# Patient Record
Sex: Female | Born: 1990 | Race: White | Hispanic: No | Marital: Married | Smoking: Never smoker
Health system: Southern US, Community
[De-identification: ages and names within clinical notes are randomized; demographics above are authoritative.]

## PROBLEM LIST (undated history)

## (undated) ENCOUNTER — Inpatient Hospital Stay: Payer: Self-pay

## (undated) DIAGNOSIS — Z87442 Personal history of urinary calculi: Secondary | ICD-10-CM

## (undated) DIAGNOSIS — J3089 Other allergic rhinitis: Secondary | ICD-10-CM

## (undated) DIAGNOSIS — Z8489 Family history of other specified conditions: Secondary | ICD-10-CM

## (undated) DIAGNOSIS — J189 Pneumonia, unspecified organism: Secondary | ICD-10-CM

## (undated) DIAGNOSIS — J45909 Unspecified asthma, uncomplicated: Secondary | ICD-10-CM

## (undated) DIAGNOSIS — K219 Gastro-esophageal reflux disease without esophagitis: Secondary | ICD-10-CM

## (undated) DIAGNOSIS — J302 Other seasonal allergic rhinitis: Secondary | ICD-10-CM

## (undated) DIAGNOSIS — Z3009 Encounter for other general counseling and advice on contraception: Secondary | ICD-10-CM

## (undated) HISTORY — PX: TONSILLECTOMY: SUR1361

## (undated) HISTORY — DX: Encounter for other general counseling and advice on contraception: Z30.09

## (undated) HISTORY — PX: WISDOM TOOTH EXTRACTION: SHX21

---
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
## 2011-03-15 ENCOUNTER — Ambulatory Visit: Payer: Self-pay | Admitting: Internal Medicine

## 2011-04-03 ENCOUNTER — Ambulatory Visit: Payer: Self-pay | Admitting: Family Medicine

## 2011-05-03 ENCOUNTER — Ambulatory Visit: Payer: Self-pay | Admitting: Internal Medicine

## 2011-12-21 ENCOUNTER — Emergency Department: Payer: Self-pay | Admitting: Unknown Physician Specialty

## 2011-12-21 LAB — URINALYSIS, COMPLETE
RBC,UR: 3 /HPF (ref 0–5)
Squamous Epithelial: 33

## 2011-12-21 LAB — CBC
HCT: 41.3 % (ref 35.0–47.0)
HGB: 13.8 g/dL (ref 12.0–16.0)
MCH: 31.3 pg (ref 26.0–34.0)
MCV: 94 fL (ref 80–100)
Platelet: 186 10*3/uL (ref 150–440)
RBC: 4.41 10*6/uL (ref 3.80–5.20)
RDW: 10.9 % — ABNORMAL LOW (ref 11.5–14.5)
WBC: 5.9 10*3/uL (ref 3.6–11.0)

## 2011-12-21 LAB — PREGNANCY, URINE: Pregnancy Test, Urine: NEGATIVE m[IU]/mL

## 2011-12-21 LAB — COMPREHENSIVE METABOLIC PANEL
Albumin: 4.2 g/dL (ref 3.8–5.6)
Anion Gap: 13 (ref 7–16)
Calcium, Total: 9 mg/dL (ref 9.0–10.7)
Creatinine: 0.74 mg/dL (ref 0.60–1.30)
EGFR (African American): >60
Glucose: 99 mg/dL (ref 65–99)
SGOT(AST): 22 U/L (ref 0–26)
SGPT (ALT): 18 U/L
Sodium: 142 mmol/L (ref 136–145)
Total Protein: 7.4 g/dL (ref 6.4–8.6)

## 2011-12-21 LAB — LIPASE, BLOOD: Lipase: 55 U/L — ABNORMAL LOW (ref 73–393)

## 2012-01-16 ENCOUNTER — Ambulatory Visit: Payer: Self-pay | Admitting: Urology

## 2012-07-02 ENCOUNTER — Ambulatory Visit: Payer: Self-pay | Admitting: Internal Medicine

## 2012-12-31 ENCOUNTER — Ambulatory Visit: Payer: Self-pay | Admitting: Physician Assistant

## 2013-04-07 ENCOUNTER — Emergency Department: Payer: Self-pay | Admitting: Emergency Medicine

## 2013-11-18 ENCOUNTER — Ambulatory Visit: Payer: Self-pay | Admitting: Otolaryngology

## 2013-11-19 LAB — PATHOLOGY REPORT

## 2014-01-30 IMAGING — CT CT HEAD WITHOUT CONTRAST
1 of 2 series · 15 of 30 positions shown, 19 images · non-contrast
Comparison: none

REASON FOR EXAM: MCV 2 days ago, LOC, dizziness, nausea, vomiting
COMMENTS:

PROCEDURE:     CT  - CT HEAD WITHOUT CONTRAST  - April 07, 2013  [DATE]
RESULT:     Abdominal CT dated
TECHNIQUE: Helical noncontrasted 3 mm sections were obtained the lung bases
through the pubic symphysis.

[Series 6: soft (id) · axial · 0.42mm/px · z∈[-198,-59]mm · 15 of 32 slices shown, 19 images]
[im 2/32  brain]
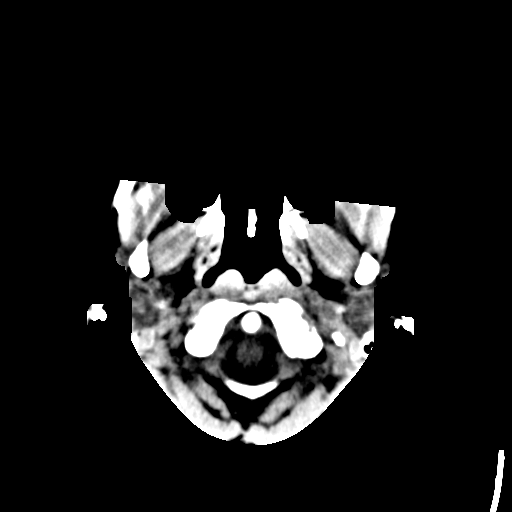
[im 2/32  bone]
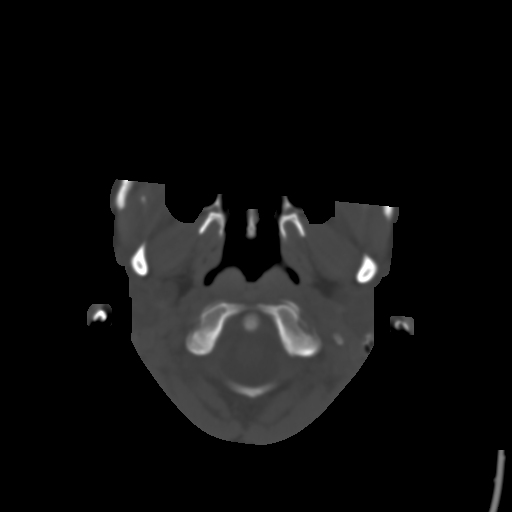
[im 4/32  brain]
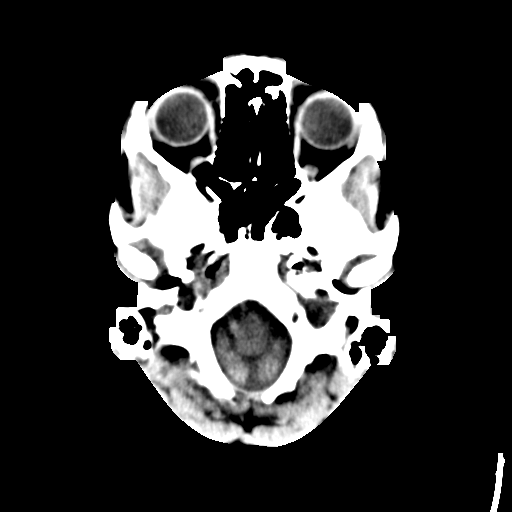
[im 6/32  brain]
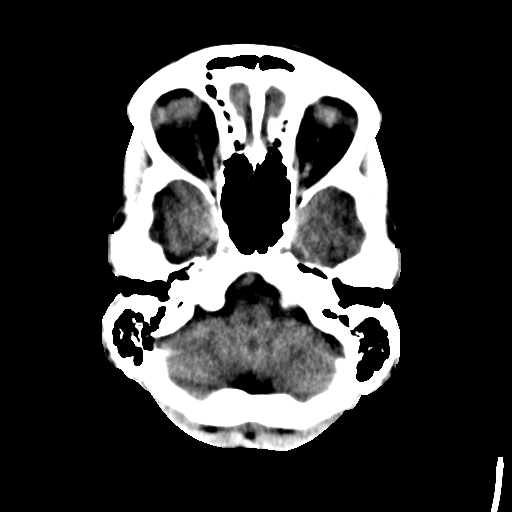
[im 8/32  brain]
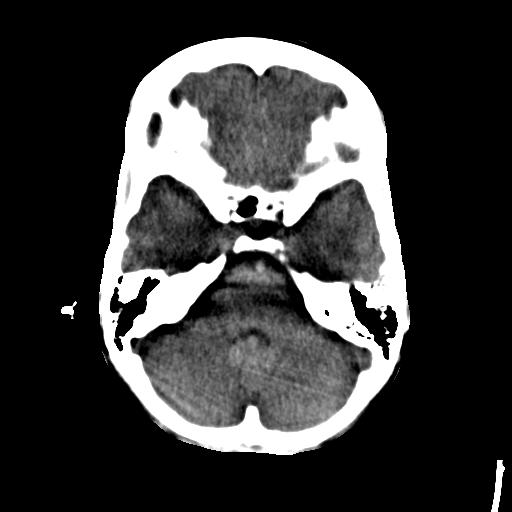
[im 10/32  brain]
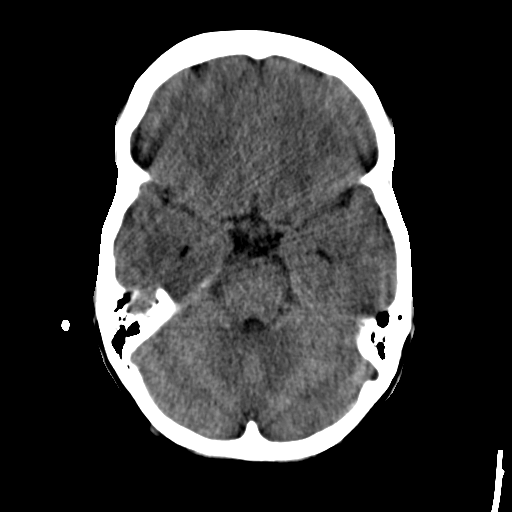
[im 10/32  bone]
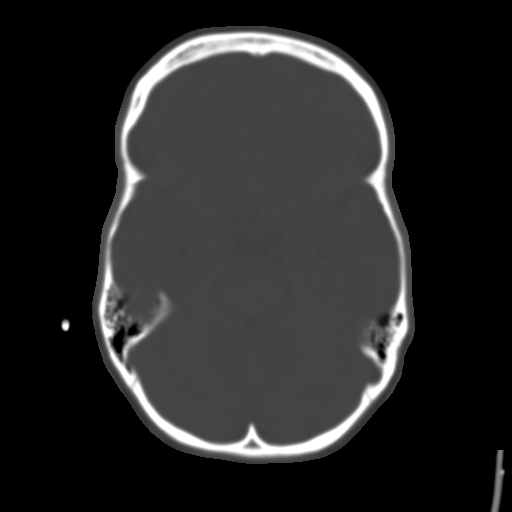
[im 12/32  brain]
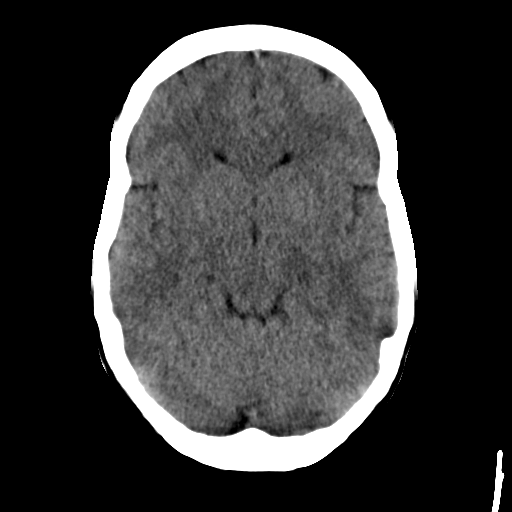
[im 13/32  brain]
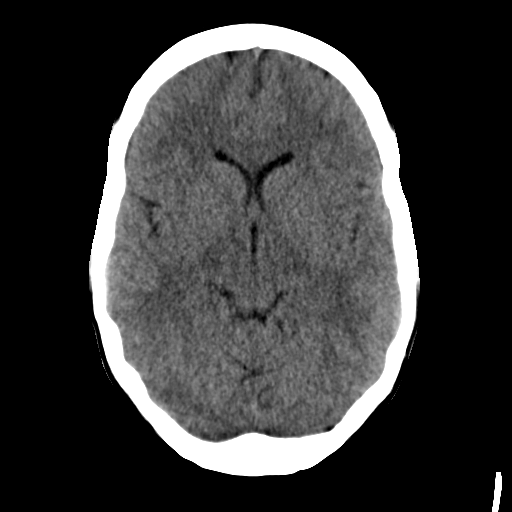
[im 16/32  brain]
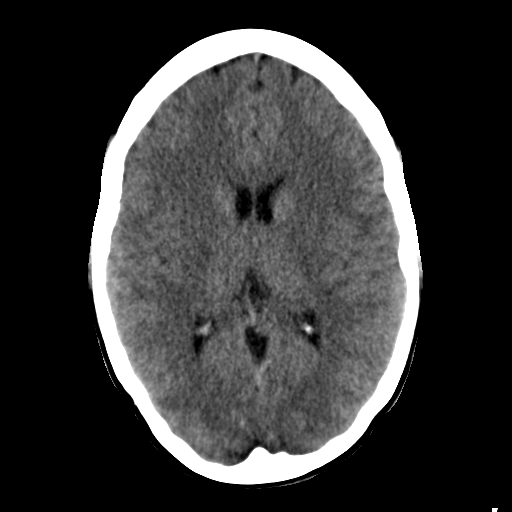
[im 19/32  brain]
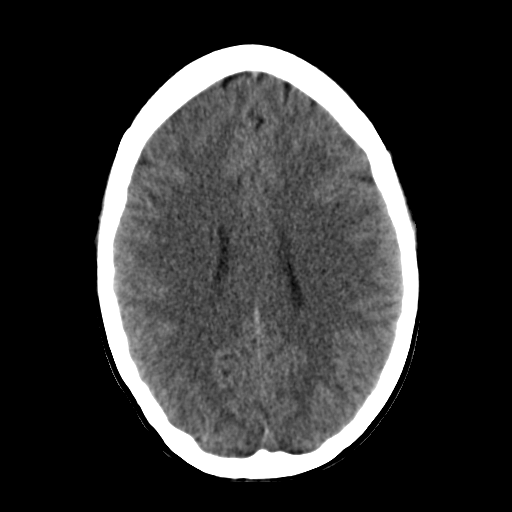
[im 19/32  bone]
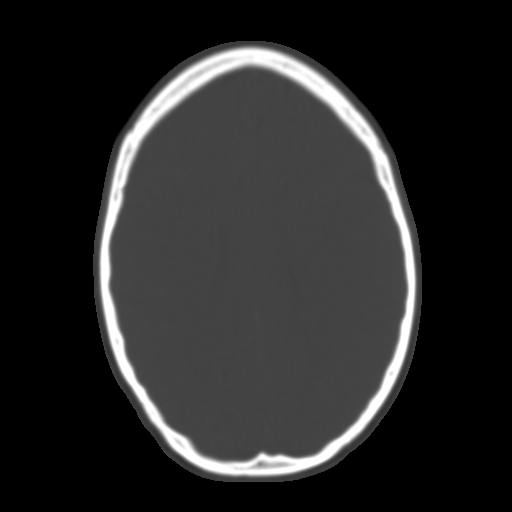
[im 20/32  brain]
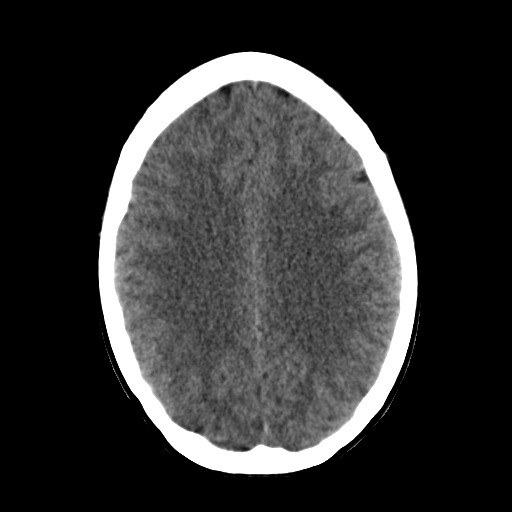
[im 22/32  brain]
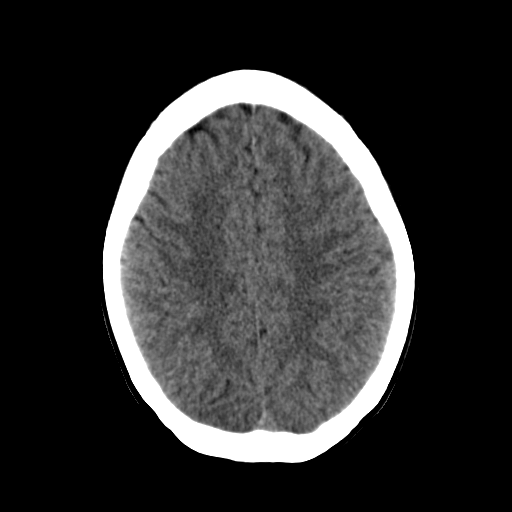
[im 24/32  brain]
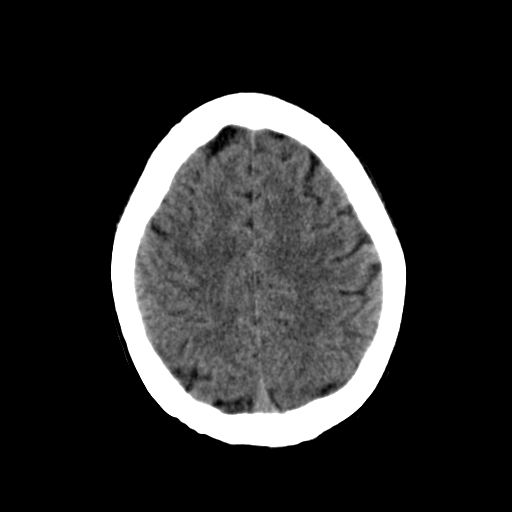
[im 26/32  brain]
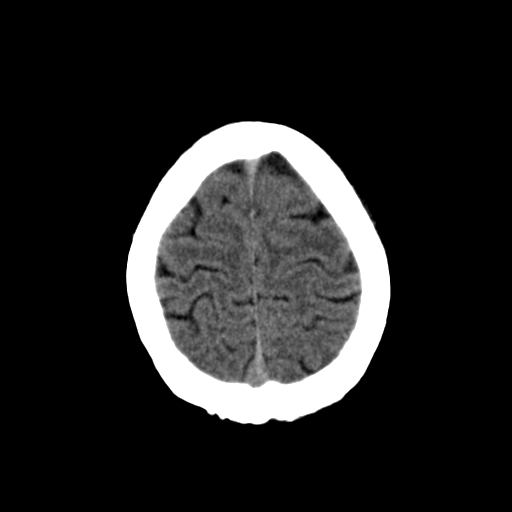
[im 26/32  bone]
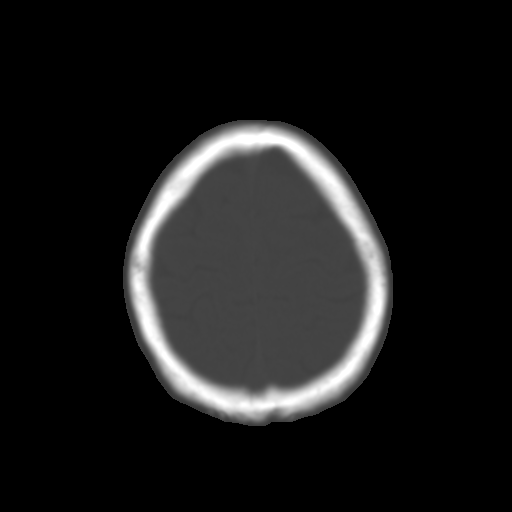
[im 28/32  brain]
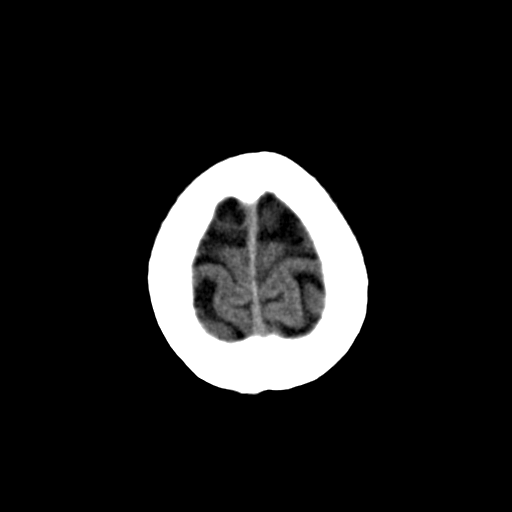
[im 30/32  brain]
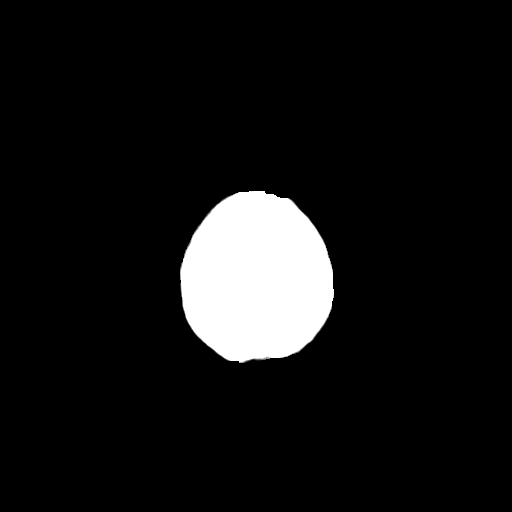

[15 of 30 positions shown; findings below may reference images not displayed]

FINDINGS: The lung bases are unremarkable.

Noncontrast evaluation of the liver, spleen, adrenals, pancreas, kidneys is
unremarkable. There is no CT evidence of bowel obstruction, enteritis,
colitis, diverticulitis, nor appendicitis within the limitations of a
noncontrasted CT. There is no evidence of abdominal aortic aneurysm. There
is no gross evidence of pathologic sized adenopathy, masses, loculated fluid
collections nor evidence of free fluid within the limitations of a
noncontrasted CT.
The visualized paranasal sinuses and mastoid air cells are patent.
IMPRESSION: No CT evidence of obstructive or inflammatory abnormalities.
2. Schalkhammer Isgum PA of the emergency department was informed of these findings
via a preliminary faxed report.

## 2014-07-06 ENCOUNTER — Ambulatory Visit: Payer: Self-pay | Admitting: Physician Assistant

## 2014-09-03 ENCOUNTER — Ambulatory Visit: Payer: Self-pay | Admitting: Physician Assistant

## 2015-03-27 NOTE — Op Note (Signed)
PATIENT NAME:  Bethany Gutierrez, Bethany Gutierrez MR#:  [id number] DATE OF BIRTH:  29-Oct-1991  DATE OF PROCEDURE:  11/18/2013  PREOPERATIVE DIAGNOSIS: Chronic tonsillitis.   POSTOPERATIVE DIAGNOSIS: Chronic tonsillitis.  PROCEDURE PERFORMED: Tonsillectomy and adenoidectomy greater than age 24.   SURGEON: Bud Facereighton Kobee Medlen, M.D.   ANESTHESIA: General endotracheal anesthesia.   ESTIMATED BLOOD LOSS: Less than 10 mL.   IV FLUIDS: Please see anesthesia record.   COMPLICATIONS: None.   DRAINS/STENT PLACEMENTS: None.   SPECIMENS: Right and left tonsils. Adenoids were ablated so no specimen obtained.   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   OPERATIVE FINDINGS: 2+ cryptic tonsils with moderate fibrosis and erythema surrounding the left tonsil.   DESCRIPTION OF PROCEDURE: After the patient was identified in holding, benefits and risks of the procedure were discussed and consent was reviewed, the patient was taken to the Operating Room and placed supine position. General endotracheal anesthesia was induced. The patient was rotated 45 degrees. A McIvor mouth gag was inserted in the patient's oral cavity and suspended from a Mayo stand. Visualization of the patient's oral cavity revealed 2+ tonsils left greater than right. A curved Allis clamp was attached to the superior pole of the patient's right tonsil. This was retracted medially and inferiorly and the patient's right tonsil was excised in a subcapsular plane. There was a moderate amount of fibrotic scarring. Hemostasis was achieved along the tonsillar bed using Bovie suction cautery and attention was directed to the patient's left tonsil. In a similar fashion, the patient's left tonsil was grasped at its superior pole. This was retracted medially and inferiorly and the patient's left tonsil was excised in a  subcapsular plane. There was a moderate amount of erythema and fibrotic scarring along the tonsillar capsule with the underlying musculature. Once this tonsil was excised in its entirety the bilateral tonsillar beds were inspected and meticulous hemostasis was ensured with Bovie suction cautery. A red rubber catheter was placed in the patient's right nasal cavity for retraction of the uvula and soft palate superiorly, and under indirect visualization, the patient's adenoid tissue was visualized and noted to be 2+ in appearance and Bovie suction cautery was used to ablate and desiccate the remaining adenoid tissue for visualization of a widely patent choana. At this time, the patient's oral cavity was copiously irrigated with sterile saline. Meticulous hemostasis was ensured and 2 mL of 0.5% plain Marcaine were injected into the anterior and posterior tonsillar pillars bilaterally. Care of the patient at this time was transferred to anesthesia, where the patient tolerated the procedure  well.     ____________________________ Kyung Ruddreighton C. Gianno Volner, MD ccv:sg D: 11/18/2013 07:53:28 ET T: 11/18/2013 08:37:27 ET JOB#: [id number]  cc: Kyung Ruddreighton C. Shaw Dobek, MD, <Dictator> Kyung RuddREIGHTON C Aviana Shevlin MD ELECTRONICALLY SIGNED 12/02/2013 10:38

## 2015-08-01 ENCOUNTER — Ambulatory Visit
Admission: EM | Admit: 2015-08-01 | Discharge: 2015-08-01 | Disposition: A | Discharge status: Home / Self Care | Payer: BLUE CROSS/BLUE SHIELD | Attending: Family Medicine | Admitting: Family Medicine

## 2015-08-01 ENCOUNTER — Encounter: Payer: Self-pay | Admitting: Emergency Medicine

## 2015-08-01 DIAGNOSIS — S39012A Strain of muscle, fascia and tendon of lower back, initial encounter: Secondary | ICD-10-CM | POA: Diagnosis not present

## 2015-08-01 DIAGNOSIS — N390 Urinary tract infection, site not specified: Secondary | ICD-10-CM | POA: Diagnosis not present

## 2015-08-01 HISTORY — DX: Other seasonal allergic rhinitis: J30.2

## 2015-08-01 LAB — URINALYSIS COMPLETE WITH MICROSCOPIC (ARMC ONLY)
BILIRUBIN URINE: NEGATIVE
GLUCOSE, UA: NEGATIVE mg/dL
Hgb urine dipstick: NEGATIVE
KETONES UR: NEGATIVE mg/dL
Leukocytes, UA: NEGATIVE
Nitrite: POSITIVE — AB
Protein, ur: NEGATIVE mg/dL
RBC / HPF: NONE SEEN RBC/hpf (ref <3)
Specific Gravity, Urine: 1.015 (ref 1.005–1.030)
pH: 7 (ref 5.0–8.0)

## 2015-08-01 MED ORDER — CYCLOBENZAPRINE HCL 10 MG PO TABS: 10.0000 mg | ORAL_TABLET | Freq: Every day | ORAL | Status: DC

## 2015-08-01 MED ORDER — FLUCONAZOLE 150 MG PO TABS: 150.0000 mg | ORAL_TABLET | Freq: Once | ORAL | Status: DC

## 2015-08-01 MED ORDER — CIPROFLOXACIN HCL 500 MG PO TABS: 500.0000 mg | ORAL_TABLET | Freq: Two times a day (BID) | ORAL | Status: DC

## 2015-08-01 NOTE — ED Provider Notes (Signed)
CSN: [id number]     Arrival date & time 08/01/15  1019 History   First MD Initiated Contact with Patient 08/01/15 1148     Chief Complaint  Patient presents with  . Flank Pain   (Consider location/radiation/quality/duration/timing/severity/associated sxs/prior Treatment) HPI Comments: 24 yo female with 1 week h/o discomfort with urination and low back pain. Denies any fevers, chills, vomiting. States recently treated for UTI but did not feel she improved.  The history is provided by the patient.    Past Medical History  Diagnosis Date  . Seasonal allergies    Past Surgical History  Procedure Laterality Date  . Tonsillectomy     No family history on file. Social History  Substance Use Topics  . Smoking status: Never Smoker   . Smokeless tobacco: Never Used  . Alcohol Use: No   OB History    No data available     Review of Systems  Allergies  Codeine and Tramadol  Home Medications   Prior to Admission medications   Medication Sig Start Date End Date Taking? Authorizing Provider  Fluticasone-Salmeterol (ADVAIR) 250-50 MCG/DOSE AEPB Inhale 1 puff into the lungs 2 (two) times daily.   Yes Historical Provider, MD  levocetirizine (XYZAL) 5 MG tablet Take 5 mg by mouth every evening.   Yes Historical Provider, MD  montelukast (SINGULAIR) 10 MG tablet Take 10 mg by mouth at bedtime.   Yes Historical Provider, MD  nitrofurantoin, macrocrystal-monohydrate, (MACROBID) 100 MG capsule Take 100 mg by mouth 2 (two) times daily.   Yes Historical Provider, MD  norethindrone-ethinyl estradiol-iron (MICROGESTIN FE,GILDESS FE,LOESTRIN FE) 1.5-30 MG-MCG tablet Take 1 tablet by mouth daily.   Yes Historical Provider, MD  ciprofloxacin (CIPRO) 500 MG tablet Take 1 tablet (500 mg total) by mouth every 12 (twelve) hours. 08/01/15   Payton Mccallum, MD  cyclobenzaprine (FLEXERIL) 10 MG tablet Take 1 tablet (10 mg total) by mouth at bedtime. 08/01/15   Payton Mccallum, MD  fluconazole (DIFLUCAN) 150 MG  tablet Take 1 tablet (150 mg total) by mouth once. 08/01/15   Payton Mccallum, MD   Meds Ordered and Administered this Visit  Medications - No data to display  BP 107/77 mmHg  Pulse 89  Temp(Src) 97.5 F (36.4 C) (Tympanic)  Resp 16  Ht 5\' 7"  (1.702 m)  Wt 115 lb (52.164 kg)  BMI 18.01 kg/m2  SpO2 97%  LMP 07/14/2015 No data found.   Physical Exam  Constitutional: She appears well-developed and well-nourished. No distress.  Abdominal: Soft. Bowel sounds are normal. She exhibits no distension and no mass. There is tenderness (mild suprapubic tenderness to palpation; no rebound or guarding). There is no rebound and no guarding.  Musculoskeletal:       Lumbar back: She exhibits tenderness (to palpation lumbar sacral paraspinous muscles on left) and spasm. She exhibits normal range of motion, no bony tenderness, no swelling, no edema, no deformity, no laceration and normal pulse.  Skin: No rash noted. She is not diaphoretic.  Nursing note and vitals reviewed.   ED Course  Procedures (including critical care time)  Labs Review Labs Reviewed  URINALYSIS COMPLETEWITH MICROSCOPIC ([facility] ONLY) - Abnormal; Notable for the following:    Nitrite POSITIVE (*)    Bacteria, UA FEW (*)    Squamous Epithelial / LPF 0-5 (*)    All other components within normal limits    Imaging Review No results found.   Visual Acuity Review  Right Eye Distance:   Left Eye Distance:  Bilateral Distance:    Right Eye Near:   Left Eye Near:    Bilateral Near:         MDM   1. UTI (lower urinary tract infection)   2. Lumbar strain, initial encounter    Discharge Medication List as of 08/01/2015 12:06 PM    START taking these medications   Details  ciprofloxacin (CIPRO) 500 MG tablet Take 1 tablet (500 mg total) by mouth every 12 (twelve) hours., Starting 08/01/2015, Until Discontinued, Print    cyclobenzaprine (FLEXERIL) 10 MG tablet Take 1 tablet (10 mg total) by mouth at bedtime.,  Starting 08/01/2015, Until Discontinued, Print    fluconazole (DIFLUCAN) 150 MG tablet Take 1 tablet (150 mg total) by mouth once., Starting 08/01/2015, Print      Plan: 1. Test results and diagnosis reviewed with patient 2. rx as per orders; risks, benefits, potential side effects reviewed with patient 3. Recommend supportive treatment with increased fluids, otc analgesics prn 4. F/u prn if symptoms worsen or don't improve    Payton Mccallum, MD 08/01/15 1710

## 2015-08-01 NOTE — ED Notes (Signed)
Having severe pain. Was seen by primary 1 week ago and was told it was a UTI. Not any better. Have a history of kidney stones

## 2015-12-17 ENCOUNTER — Ambulatory Visit
Admission: RE | Admit: 2015-12-17 | Discharge: 2015-12-17 | Disposition: A | Discharge status: Home / Self Care | Payer: BLUE CROSS/BLUE SHIELD | Source: Ambulatory Visit | Attending: Nurse Practitioner | Admitting: Nurse Practitioner

## 2015-12-17 ENCOUNTER — Other Ambulatory Visit: Payer: Self-pay | Admitting: Nurse Practitioner

## 2015-12-17 DIAGNOSIS — R10813 Right lower quadrant abdominal tenderness: Secondary | ICD-10-CM

## 2015-12-17 DIAGNOSIS — R1031 Right lower quadrant pain: Secondary | ICD-10-CM | POA: Diagnosis present

## 2015-12-17 DIAGNOSIS — K769 Liver disease, unspecified: Secondary | ICD-10-CM | POA: Insufficient documentation

## 2015-12-17 MED ORDER — IOHEXOL 300 MG/ML  SOLN
80.0000 mL | Freq: Once | INTRAMUSCULAR | Status: AC | PRN
  Administered 2015-12-17: 80 mL via INTRAVENOUS

## 2015-12-18 ENCOUNTER — Other Ambulatory Visit: Payer: Self-pay | Admitting: Nurse Practitioner

## 2015-12-18 DIAGNOSIS — R1031 Right lower quadrant pain: Secondary | ICD-10-CM

## 2016-06-06 DIAGNOSIS — J301 Allergic rhinitis due to pollen: Secondary | ICD-10-CM | POA: Diagnosis not present

## 2016-06-06 DIAGNOSIS — J3089 Other allergic rhinitis: Secondary | ICD-10-CM | POA: Diagnosis not present

## 2016-08-19 DIAGNOSIS — J301 Allergic rhinitis due to pollen: Secondary | ICD-10-CM | POA: Diagnosis not present

## 2016-08-19 DIAGNOSIS — J3089 Other allergic rhinitis: Secondary | ICD-10-CM | POA: Diagnosis not present

## 2016-08-22 DIAGNOSIS — R0982 Postnasal drip: Secondary | ICD-10-CM | POA: Diagnosis not present

## 2016-08-22 DIAGNOSIS — J301 Allergic rhinitis due to pollen: Secondary | ICD-10-CM | POA: Diagnosis not present

## 2016-09-27 DIAGNOSIS — R300 Dysuria: Secondary | ICD-10-CM | POA: Diagnosis not present

## 2016-09-27 DIAGNOSIS — N3001 Acute cystitis with hematuria: Secondary | ICD-10-CM | POA: Diagnosis not present

## 2016-11-24 DIAGNOSIS — J069 Acute upper respiratory infection, unspecified: Secondary | ICD-10-CM | POA: Diagnosis not present

## 2016-11-24 DIAGNOSIS — B373 Candidiasis of vulva and vagina: Secondary | ICD-10-CM | POA: Diagnosis not present

## 2017-01-10 DIAGNOSIS — R1084 Generalized abdominal pain: Secondary | ICD-10-CM | POA: Diagnosis not present

## 2017-01-10 DIAGNOSIS — R112 Nausea with vomiting, unspecified: Secondary | ICD-10-CM | POA: Diagnosis not present

## 2017-01-10 DIAGNOSIS — K219 Gastro-esophageal reflux disease without esophagitis: Secondary | ICD-10-CM | POA: Diagnosis not present

## 2017-01-30 DIAGNOSIS — J309 Allergic rhinitis, unspecified: Secondary | ICD-10-CM | POA: Diagnosis not present

## 2017-01-30 DIAGNOSIS — J45998 Other asthma: Secondary | ICD-10-CM | POA: Diagnosis not present

## 2017-02-22 DIAGNOSIS — J019 Acute sinusitis, unspecified: Secondary | ICD-10-CM | POA: Diagnosis not present

## 2017-02-22 DIAGNOSIS — R0789 Other chest pain: Secondary | ICD-10-CM | POA: Diagnosis not present

## 2017-04-28 ENCOUNTER — Telehealth: Payer: Self-pay | Admitting: Emergency Medicine

## 2017-04-28 ENCOUNTER — Emergency Department
Admission: EM | Admit: 2017-04-28 | Discharge: 2017-04-28 | Disposition: A | Discharge status: Home / Self Care | Payer: BLUE CROSS/BLUE SHIELD | Attending: Dermatology | Admitting: Dermatology

## 2017-04-28 DIAGNOSIS — R109 Unspecified abdominal pain: Secondary | ICD-10-CM | POA: Diagnosis not present

## 2017-04-28 DIAGNOSIS — Z5321 Procedure and treatment not carried out due to patient leaving prior to being seen by health care provider: Secondary | ICD-10-CM | POA: Diagnosis not present

## 2017-04-28 DIAGNOSIS — R112 Nausea with vomiting, unspecified: Secondary | ICD-10-CM | POA: Insufficient documentation

## 2017-04-28 LAB — COMPREHENSIVE METABOLIC PANEL
ALBUMIN: 4.2 g/dL (ref 3.5–5.0)
ALT: 11 U/L — AB (ref 14–54)
ANION GAP: 5 (ref 5–15)
AST: 18 U/L (ref 15–41)
Alkaline Phosphatase: 58 U/L (ref 38–126)
BUN: 12 mg/dL (ref 6–20)
CHLORIDE: 108 mmol/L (ref 101–111)
CO2: 23 mmol/L (ref 22–32)
Calcium: 8.9 mg/dL (ref 8.9–10.3)
Creatinine, Ser: 0.55 mg/dL (ref 0.44–1.00)
GFR calc non Af Amer: >60 mL/min (ref >60)
GLUCOSE: 94 mg/dL (ref 65–99)
Potassium: 3.5 mmol/L (ref 3.5–5.1)
SODIUM: 136 mmol/L (ref 135–145)
Total Bilirubin: 0.8 mg/dL (ref 0.3–1.2)
Total Protein: 7.1 g/dL (ref 6.5–8.1)

## 2017-04-28 LAB — CBC
HCT: 42.8 % (ref 35.0–47.0)
Hemoglobin: 14.8 g/dL (ref 12.0–16.0)
MCH: 32.9 pg (ref 26.0–34.0)
MCHC: 34.6 g/dL (ref 32.0–36.0)
MCV: 94.9 fL (ref 80.0–100.0)
PLATELETS: 175 10*3/uL (ref 150–440)
RBC: 4.51 MIL/uL (ref 3.80–5.20)
RDW: 11.7 % (ref 11.5–14.5)
WBC: 4.9 10*3/uL (ref 3.6–11.0)

## 2017-04-28 LAB — LIPASE, BLOOD: Lipase: 18 U/L (ref 11–51)

## 2017-04-28 MED ORDER — ONDANSETRON 4 MG PO TBDP
4.0000 mg | ORAL_TABLET | Freq: Once | ORAL | Status: AC
  Administered 2017-04-28: 4 mg via ORAL

## 2017-04-28 MED ORDER — ONDANSETRON 4 MG PO TBDP
ORAL_TABLET | ORAL | Status: AC
  Filled 2017-04-28: qty 1

## 2017-04-28 NOTE — Telephone Encounter (Signed)
Called patient due to lwot to inquire about condition and follow up plans. She says she does not have abdominal pain now.  Says it is just diarrhea and she said she may have a gi bug.  I advised her to return if she gets pain again, and she will also follow up with her doctor.

## 2017-04-28 NOTE — ED Triage Notes (Signed)
Pt in with co mid abd pain that started yesterday radiating to right abd. Has had vomiting but no diarrhea or dysuria.

## 2017-04-30 DIAGNOSIS — R1084 Generalized abdominal pain: Secondary | ICD-10-CM | POA: Diagnosis not present

## 2017-04-30 DIAGNOSIS — K219 Gastro-esophageal reflux disease without esophagitis: Secondary | ICD-10-CM | POA: Diagnosis not present

## 2017-04-30 DIAGNOSIS — K529 Noninfective gastroenteritis and colitis, unspecified: Secondary | ICD-10-CM | POA: Diagnosis not present

## 2017-04-30 DIAGNOSIS — N83209 Unspecified ovarian cyst, unspecified side: Secondary | ICD-10-CM | POA: Diagnosis not present

## 2017-04-30 DIAGNOSIS — Z885 Allergy status to narcotic agent status: Secondary | ICD-10-CM | POA: Diagnosis not present

## 2017-05-18 DIAGNOSIS — K219 Gastro-esophageal reflux disease without esophagitis: Secondary | ICD-10-CM | POA: Diagnosis not present

## 2017-05-18 DIAGNOSIS — R197 Diarrhea, unspecified: Secondary | ICD-10-CM | POA: Diagnosis not present

## 2017-05-18 DIAGNOSIS — K5289 Other specified noninfective gastroenteritis and colitis: Secondary | ICD-10-CM | POA: Diagnosis not present

## 2017-05-22 ENCOUNTER — Other Ambulatory Visit: Payer: Self-pay | Admitting: Nurse Practitioner

## 2017-05-22 ENCOUNTER — Ambulatory Visit
Admission: RE | Admit: 2017-05-22 | Discharge: 2017-05-22 | Disposition: A | Discharge status: Home / Self Care | Payer: BLUE CROSS/BLUE SHIELD | Source: Ambulatory Visit | Attending: Nurse Practitioner | Admitting: Nurse Practitioner

## 2017-05-22 ENCOUNTER — Other Ambulatory Visit
Admission: RE | Admit: 2017-05-22 | Discharge: 2017-05-22 | Disposition: A | Discharge status: Home / Self Care | Payer: BLUE CROSS/BLUE SHIELD | Source: Ambulatory Visit | Attending: Nurse Practitioner | Admitting: Nurse Practitioner

## 2017-05-22 DIAGNOSIS — R197 Diarrhea, unspecified: Secondary | ICD-10-CM | POA: Diagnosis not present

## 2017-05-22 DIAGNOSIS — R10813 Right lower quadrant abdominal tenderness: Secondary | ICD-10-CM

## 2017-05-22 DIAGNOSIS — R109 Unspecified abdominal pain: Secondary | ICD-10-CM | POA: Insufficient documentation

## 2017-05-22 HISTORY — DX: Unspecified asthma, uncomplicated: J45.909

## 2017-05-22 LAB — BASIC METABOLIC PANEL
ANION GAP: 7 (ref 5–15)
BUN: 13 mg/dL (ref 6–20)
CALCIUM: 9.1 mg/dL (ref 8.9–10.3)
CO2: 25 mmol/L (ref 22–32)
Chloride: 105 mmol/L (ref 101–111)
Creatinine, Ser: 0.69 mg/dL (ref 0.44–1.00)
GLUCOSE: 90 mg/dL (ref 65–99)
Potassium: 3.8 mmol/L (ref 3.5–5.1)
Sodium: 137 mmol/L (ref 135–145)

## 2017-05-22 LAB — HCG, QUANTITATIVE, PREGNANCY: hCG, Beta Chain, Quant, S: <1 m[IU]/mL (ref <5)

## 2017-05-22 MED ORDER — IOPAMIDOL (ISOVUE-300) INJECTION 61%
80.0000 mL | Freq: Once | INTRAVENOUS | Status: AC | PRN
  Administered 2017-05-22: 13:00:00 via INTRAVENOUS

## 2017-06-06 DIAGNOSIS — R197 Diarrhea, unspecified: Secondary | ICD-10-CM | POA: Diagnosis not present

## 2017-06-06 DIAGNOSIS — K5289 Other specified noninfective gastroenteritis and colitis: Secondary | ICD-10-CM | POA: Diagnosis not present

## 2017-06-19 DIAGNOSIS — J305 Allergic rhinitis due to food: Secondary | ICD-10-CM | POA: Diagnosis not present

## 2017-06-19 DIAGNOSIS — J301 Allergic rhinitis due to pollen: Secondary | ICD-10-CM | POA: Diagnosis not present

## 2017-06-27 DIAGNOSIS — J301 Allergic rhinitis due to pollen: Secondary | ICD-10-CM | POA: Diagnosis not present

## 2017-07-14 DIAGNOSIS — J301 Allergic rhinitis due to pollen: Secondary | ICD-10-CM | POA: Diagnosis not present

## 2017-07-17 DIAGNOSIS — J019 Acute sinusitis, unspecified: Secondary | ICD-10-CM | POA: Diagnosis not present

## 2017-07-17 DIAGNOSIS — J029 Acute pharyngitis, unspecified: Secondary | ICD-10-CM | POA: Diagnosis not present

## 2017-07-21 DIAGNOSIS — J029 Acute pharyngitis, unspecified: Secondary | ICD-10-CM | POA: Diagnosis not present

## 2017-07-27 DIAGNOSIS — J301 Allergic rhinitis due to pollen: Secondary | ICD-10-CM | POA: Diagnosis not present

## 2017-07-31 DIAGNOSIS — J301 Allergic rhinitis due to pollen: Secondary | ICD-10-CM | POA: Diagnosis not present

## 2017-08-03 DIAGNOSIS — J301 Allergic rhinitis due to pollen: Secondary | ICD-10-CM | POA: Diagnosis not present

## 2017-08-04 DIAGNOSIS — J301 Allergic rhinitis due to pollen: Secondary | ICD-10-CM | POA: Diagnosis not present

## 2017-08-10 DIAGNOSIS — J301 Allergic rhinitis due to pollen: Secondary | ICD-10-CM | POA: Diagnosis not present

## 2017-08-14 DIAGNOSIS — J301 Allergic rhinitis due to pollen: Secondary | ICD-10-CM | POA: Diagnosis not present

## 2017-08-17 DIAGNOSIS — J301 Allergic rhinitis due to pollen: Secondary | ICD-10-CM | POA: Diagnosis not present

## 2017-08-24 DIAGNOSIS — J301 Allergic rhinitis due to pollen: Secondary | ICD-10-CM | POA: Diagnosis not present

## 2017-08-28 DIAGNOSIS — J301 Allergic rhinitis due to pollen: Secondary | ICD-10-CM | POA: Diagnosis not present

## 2017-08-29 DIAGNOSIS — J301 Allergic rhinitis due to pollen: Secondary | ICD-10-CM | POA: Diagnosis not present

## 2017-08-31 DIAGNOSIS — J301 Allergic rhinitis due to pollen: Secondary | ICD-10-CM | POA: Diagnosis not present

## 2017-09-03 DIAGNOSIS — N390 Urinary tract infection, site not specified: Secondary | ICD-10-CM | POA: Diagnosis not present

## 2017-09-03 DIAGNOSIS — R350 Frequency of micturition: Secondary | ICD-10-CM | POA: Diagnosis not present

## 2017-09-04 DIAGNOSIS — J301 Allergic rhinitis due to pollen: Secondary | ICD-10-CM | POA: Diagnosis not present

## 2017-09-06 DIAGNOSIS — N390 Urinary tract infection, site not specified: Secondary | ICD-10-CM | POA: Diagnosis not present

## 2017-09-06 DIAGNOSIS — B373 Candidiasis of vulva and vagina: Secondary | ICD-10-CM | POA: Diagnosis not present

## 2017-09-11 DIAGNOSIS — J301 Allergic rhinitis due to pollen: Secondary | ICD-10-CM | POA: Diagnosis not present

## 2017-09-18 DIAGNOSIS — J301 Allergic rhinitis due to pollen: Secondary | ICD-10-CM | POA: Diagnosis not present

## 2017-09-21 DIAGNOSIS — J301 Allergic rhinitis due to pollen: Secondary | ICD-10-CM | POA: Diagnosis not present

## 2017-09-25 DIAGNOSIS — Z23 Encounter for immunization: Secondary | ICD-10-CM | POA: Diagnosis not present

## 2017-09-25 DIAGNOSIS — J3489 Other specified disorders of nose and nasal sinuses: Secondary | ICD-10-CM | POA: Diagnosis not present

## 2017-09-25 DIAGNOSIS — J988 Other specified respiratory disorders: Secondary | ICD-10-CM | POA: Diagnosis not present

## 2017-10-02 DIAGNOSIS — J301 Allergic rhinitis due to pollen: Secondary | ICD-10-CM | POA: Diagnosis not present

## 2017-10-05 DIAGNOSIS — J301 Allergic rhinitis due to pollen: Secondary | ICD-10-CM | POA: Diagnosis not present

## 2017-10-16 DIAGNOSIS — J301 Allergic rhinitis due to pollen: Secondary | ICD-10-CM | POA: Diagnosis not present

## 2017-10-19 DIAGNOSIS — J301 Allergic rhinitis due to pollen: Secondary | ICD-10-CM | POA: Diagnosis not present

## 2017-10-23 DIAGNOSIS — J301 Allergic rhinitis due to pollen: Secondary | ICD-10-CM | POA: Diagnosis not present

## 2017-10-30 DIAGNOSIS — J301 Allergic rhinitis due to pollen: Secondary | ICD-10-CM | POA: Diagnosis not present

## 2017-11-08 DIAGNOSIS — J019 Acute sinusitis, unspecified: Secondary | ICD-10-CM | POA: Diagnosis not present

## 2017-11-08 DIAGNOSIS — B373 Candidiasis of vulva and vagina: Secondary | ICD-10-CM | POA: Diagnosis not present

## 2017-11-08 DIAGNOSIS — B9689 Other specified bacterial agents as the cause of diseases classified elsewhere: Secondary | ICD-10-CM | POA: Diagnosis not present

## 2017-11-08 DIAGNOSIS — J029 Acute pharyngitis, unspecified: Secondary | ICD-10-CM | POA: Diagnosis not present

## 2017-11-17 DIAGNOSIS — J301 Allergic rhinitis due to pollen: Secondary | ICD-10-CM | POA: Diagnosis not present

## 2017-11-21 ENCOUNTER — Other Ambulatory Visit: Payer: Self-pay | Admitting: Nurse Practitioner

## 2017-11-21 DIAGNOSIS — J301 Allergic rhinitis due to pollen: Secondary | ICD-10-CM | POA: Diagnosis not present

## 2017-11-21 DIAGNOSIS — R110 Nausea: Secondary | ICD-10-CM

## 2017-11-21 MED ORDER — SCOPOLAMINE 1 MG/3DAYS TD PT72: 1.0000 | MEDICATED_PATCH | TRANSDERMAL | 1 refills | Status: DC

## 2017-11-21 NOTE — Progress Notes (Signed)
Sent in scopalomine patches for upcoming cruise

## 2018-01-12 DIAGNOSIS — J45901 Unspecified asthma with (acute) exacerbation: Secondary | ICD-10-CM | POA: Diagnosis not present

## 2018-01-12 DIAGNOSIS — J111 Influenza due to unidentified influenza virus with other respiratory manifestations: Secondary | ICD-10-CM | POA: Diagnosis not present

## 2018-02-02 DIAGNOSIS — J301 Allergic rhinitis due to pollen: Secondary | ICD-10-CM | POA: Diagnosis not present

## 2018-02-15 DIAGNOSIS — J301 Allergic rhinitis due to pollen: Secondary | ICD-10-CM | POA: Diagnosis not present

## 2018-03-16 IMAGING — CT CT ABD-PELV W/ CM
2 of 4 series · 15 of 46 positions shown, 17 images · IV contrast (APPLIED)
Comparison: CT scan 12/17/2015

CLINICAL DATA: Midline and left upper abdominal pain for 3 weeks
with episodes of nausea and diarrhea.

EXAM:
CT ABDOMEN AND PELVIS WITH CONTRAST
TECHNIQUE: Multidetector CT imaging of the abdomen and pelvis was performed
using the standard protocol following bolus administration of
intravenous contrast.
CONTRAST:  80 cc SVA7AX-FMM IOPAMIDOL (SVA7AX-FMM) INJECTION 61%

[Series 2: axial st · axial · 0.59mm/px · z∈[-425,-65]mm · 12 of 86 slices shown, 14 images]
[im 7/86  soft-tissue]
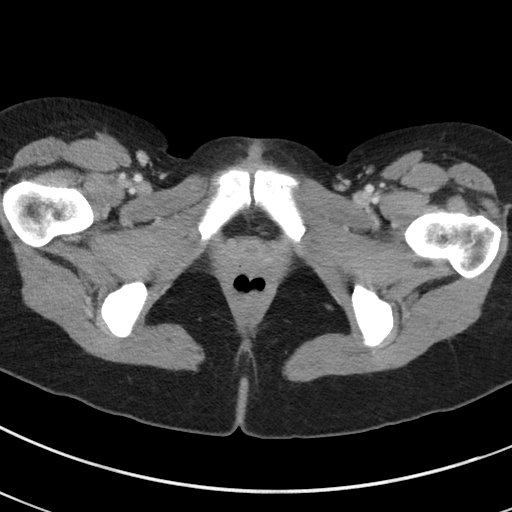
[im 7/86  bone]
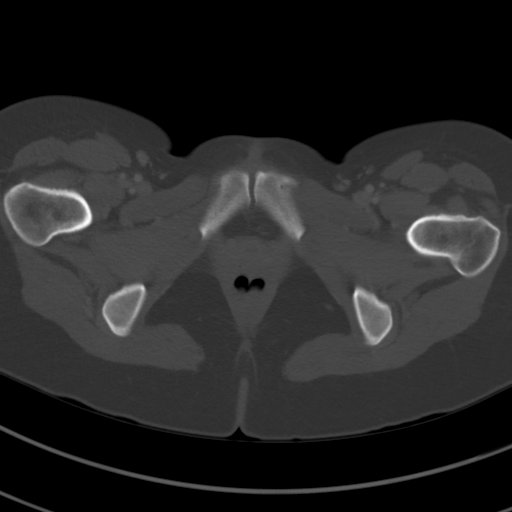
[im 14/86  soft-tissue]
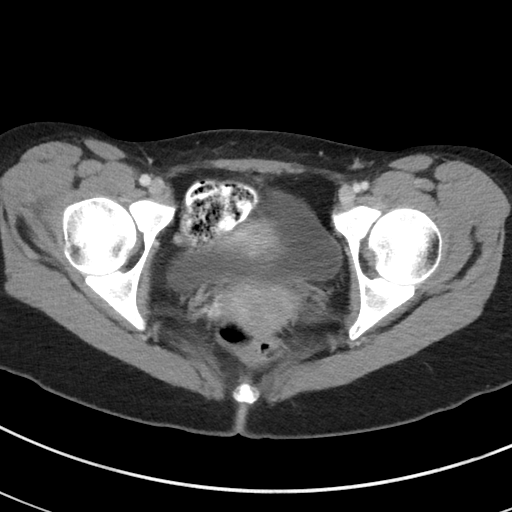
[im 20/86  soft-tissue]
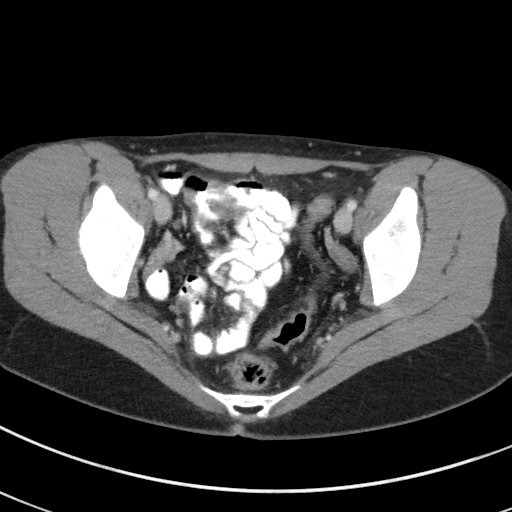
[im 27/86  soft-tissue]
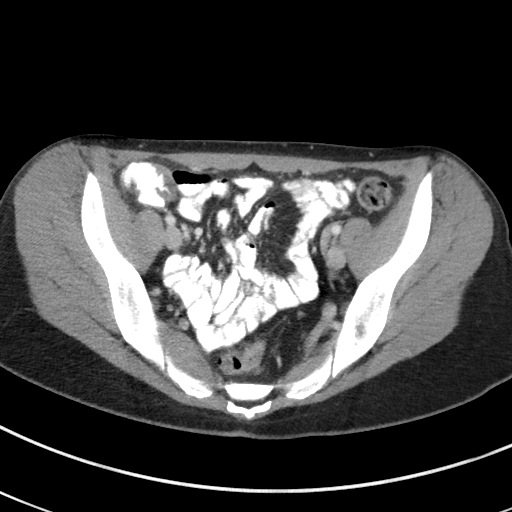
[im 33/86  soft-tissue]
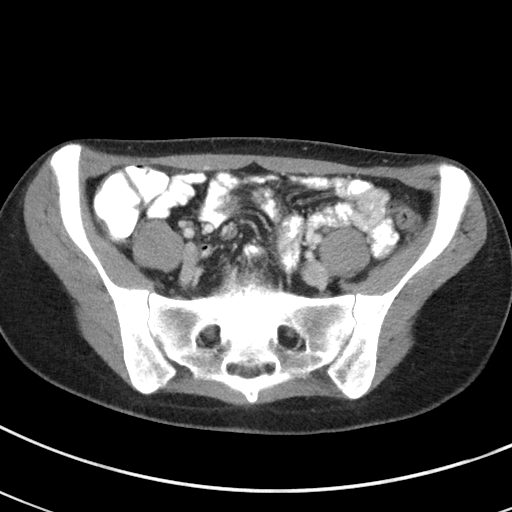
[im 40/86  soft-tissue]
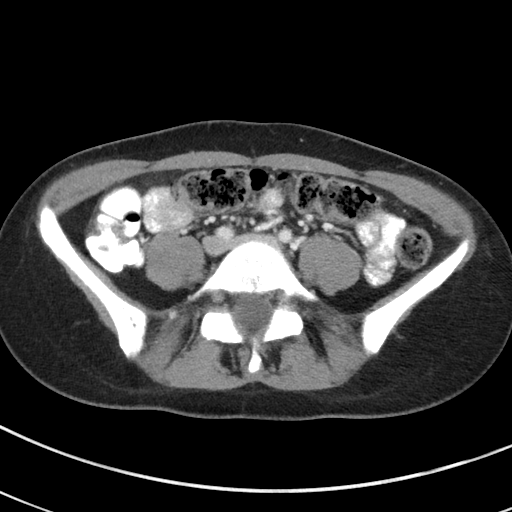
[im 46/86  soft-tissue]
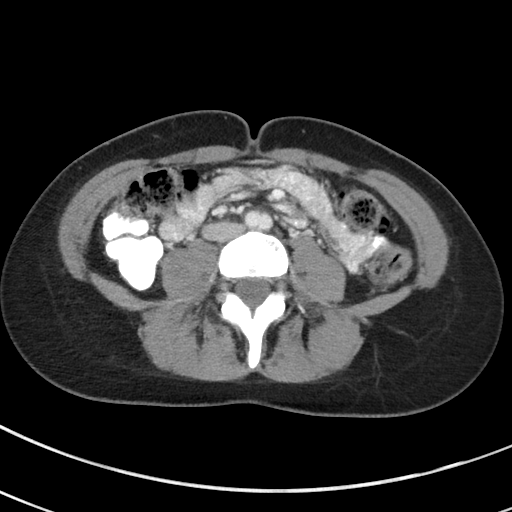
[im 53/86  soft-tissue]
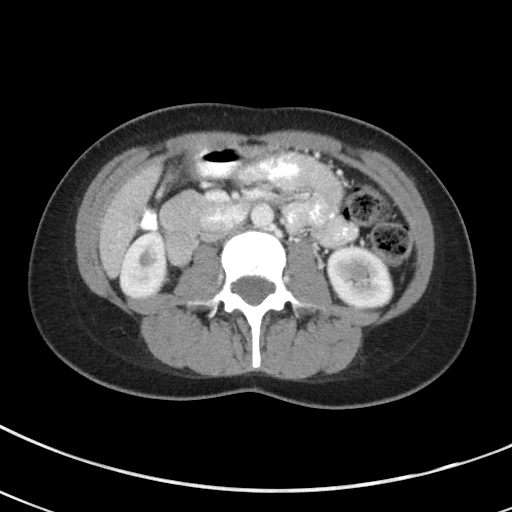
[im 59/86  soft-tissue]
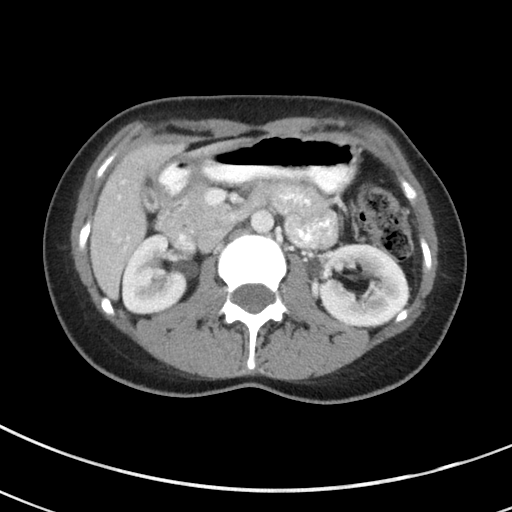
[im 59/86  bone]
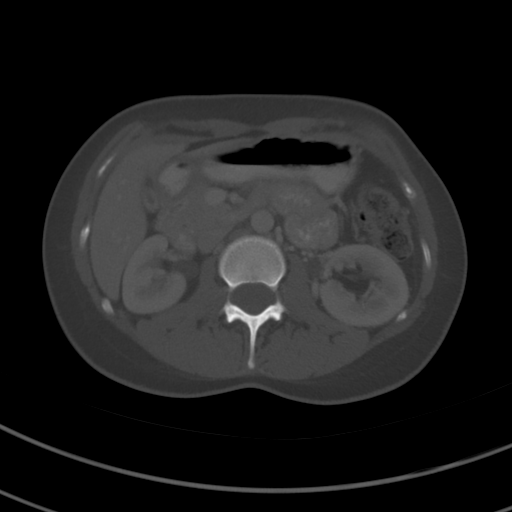
[im 66/86  soft-tissue]
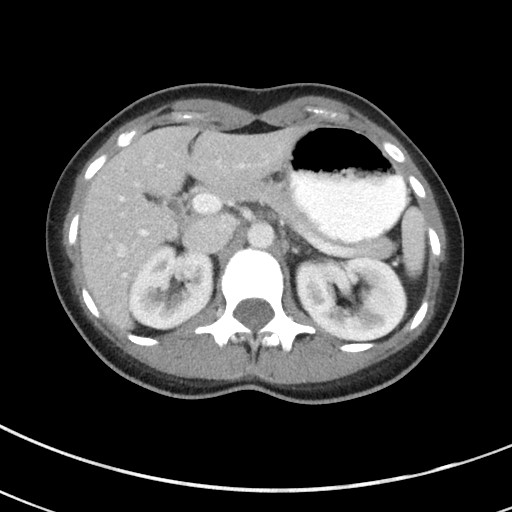
[im 72/86  soft-tissue]
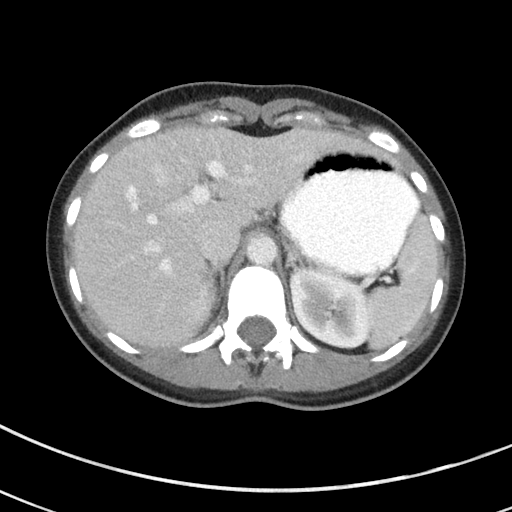
[im 79/86  soft-tissue]
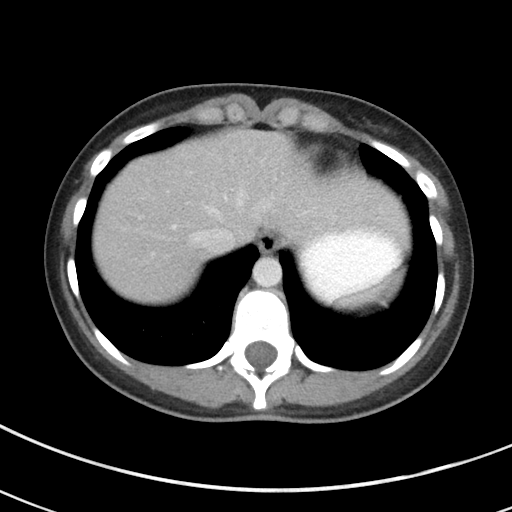

[Series 5: coronal st · coronal · 0.56mm/px · 3 of 68 slices shown]
[im 23/68  soft-tissue]
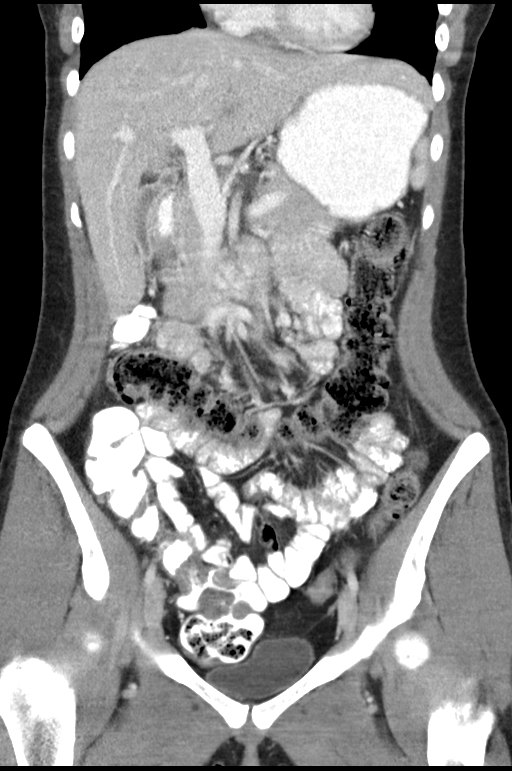
[im 30/68  soft-tissue]
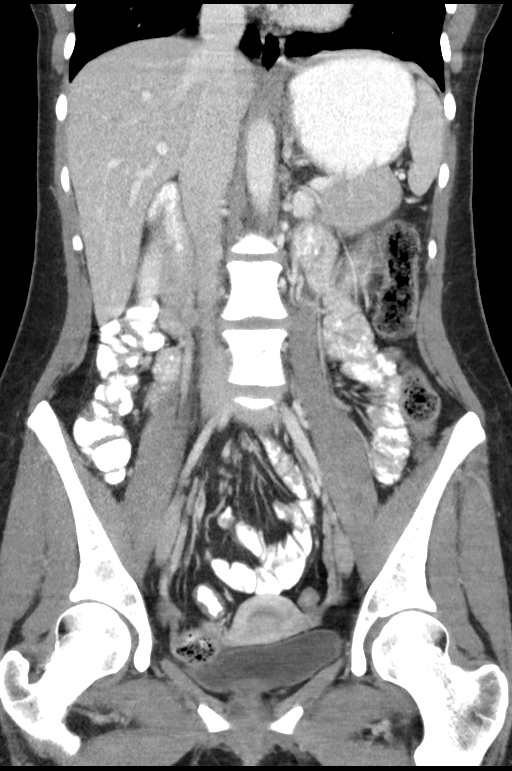
[im 38/68  soft-tissue]
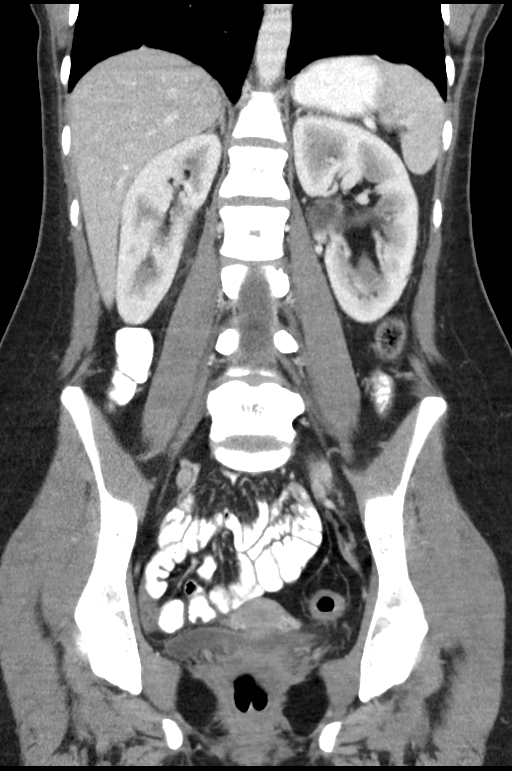

[15 of 46 positions shown; findings below may reference images not displayed]

FINDINGS: Lower chest: The lung bases are clear of acute process. No pleural
effusion or pulmonary lesions. The heart is normal in size. No
pericardial effusion. The distal esophagus and aorta are
unremarkable.

Hepatobiliary: No focal hepatic lesions or intrahepatic biliary
dilatation. The portal and hepatic veins are patent. The gallbladder
is mildly contracted. No common bile duct dilatation.

Pancreas: No mass, inflammation or ductal dilatation.

Spleen: Normal size.  No focal lesions.

Adrenals/Urinary Tract: The adrenal glands and kidneys are
unremarkable. No ureteral or bladder calculi.

Stomach/Bowel: The stomach, duodenum, small bowel and colon are
unremarkable. No acute inflammatory changes, mass lesions or
obstructive findings. The terminal ileum is normal. The appendix is
normal.

Vascular/Lymphatic: The aorta is normal in caliber. No dissection.
The branch vessels are patent. The major venous structures are
patent. No mesenteric or retroperitoneal mass or adenopathy. Small
scattered lymph nodes are noted.

Reproductive: The uterus and ovaries are unremarkable. Fundal
fibroid noted.

Other: No pelvic mass or adenopathy. No free pelvic fluid
collections. No inguinal mass or adenopathy. No abdominal wall
hernia or subcutaneous lesions.

Musculoskeletal: No significant bony findings.
IMPRESSION: 1. No acute abdominal/pelvic findings, mass lesions or
lymphadenopathy.
2. No findings to explain the patient's abdominal pain and diarrhea.

## 2018-05-18 DIAGNOSIS — J301 Allergic rhinitis due to pollen: Secondary | ICD-10-CM | POA: Diagnosis not present

## 2018-05-21 DIAGNOSIS — J301 Allergic rhinitis due to pollen: Secondary | ICD-10-CM | POA: Diagnosis not present

## 2018-07-09 ENCOUNTER — Encounter: Payer: Self-pay | Admitting: Certified Nurse Midwife

## 2018-07-09 ENCOUNTER — Ambulatory Visit: Payer: BLUE CROSS/BLUE SHIELD | Admitting: Certified Nurse Midwife

## 2018-07-09 VITALS — BP 107/78 | HR 100 | Ht 67.0 in | Wt 137.4 lb

## 2018-07-09 DIAGNOSIS — Z3201 Encounter for pregnancy test, result positive: Secondary | ICD-10-CM | POA: Diagnosis not present

## 2018-07-09 DIAGNOSIS — N912 Amenorrhea, unspecified: Secondary | ICD-10-CM

## 2018-07-09 LAB — POCT URINE PREGNANCY: Preg Test, Ur: POSITIVE — AB

## 2018-07-09 NOTE — Patient Instructions (Signed)
Eating Plan for Pregnant Women While you are pregnant, your body will require additional nutrition to help support your growing baby. It is recommended that you consume:  150 additional calories each day during your first trimester.  300 additional calories each day during your second trimester.  300 additional calories each day during your third trimester.  Eating a healthy, well-balanced diet is very important for your health and for your baby's health. You also have a higher need for some vitamins and minerals, such as folic acid, calcium, iron, and vitamin D. What do I need to know about eating during pregnancy?  Do not try to lose weight or go on a diet during pregnancy.  Choose healthy, nutritious foods. Choose  of a sandwich with a glass of milk instead of a candy bar or a high-calorie sugar-sweetened beverage.  Limit your overall intake of foods that have "empty calories." These are foods that have little nutritional value, such as sweets, desserts, candies, sugar-sweetened beverages, and fried foods.  Eat a variety of foods, especially fruits and vegetables.  Take a prenatal vitamin to help meet the additional needs during pregnancy, specifically for folic acid, iron, calcium, and vitamin D.  Remember to stay active. Ask your health care provider for exercise recommendations that are specific to you.  Practice good food safety and cleanliness, such as washing your hands before you eat and after you prepare raw meat. This helps to prevent foodborne illnesses, such as listeriosis, that can be very dangerous for your baby. Ask your health care provider for more information about listeriosis. What does 150 extra calories look like? Healthy options for an additional 150 calories each day could be any of the following:  Plain low-fat yogurt (6-8 oz) with  cup of berries.  1 apple with 2 teaspoons of peanut butter.  Cut-up vegetables with  cup of hummus.  Low-fat chocolate milk  (8 oz or 1 cup).  1 string cheese with 1 medium orange.   of a peanut butter and jelly sandwich on whole-wheat bread (1 tsp of peanut butter).  For 300 calories, you could eat two of those healthy options each day. What is a healthy amount of weight to gain? The recommended amount of weight for you to gain is based on your pre-pregnancy BMI. If your pre-pregnancy BMI was:  Less than 18 (underweight), you should gain 28-40 lb.  18-24.9 (normal), you should gain 25-35 lb.  25-29.9 (overweight), you should gain 15-25 lb.  Greater than 30 (obese), you should gain 11-20 lb.  What if I am having twins or multiples? Generally, pregnant women who will be having twins or multiples may need to increase their daily calories by 300-600 calories each day. The recommended range for total weight gain is 25-54 lb, depending on your pre-pregnancy BMI. Talk with your health care provider for specific guidance about additional nutritional needs, weight gain, and exercise during your pregnancy. What foods can I eat? Grains Any grains. Try to choose whole grains, such as whole-wheat bread, oatmeal, or brown rice. Vegetables Any vegetables. Try to eat a variety of colors and types of vegetables to get a full range of vitamins and minerals. Remember to wash your vegetables well before eating. Fruits Any fruits. Try to eat a variety of colors and types of fruit to get a full range of vitamins and minerals. Remember to wash your fruits well before eating. Meats and Other Protein Sources Lean meats, including chicken, turkey, fish, and lean cuts of beef, veal,   or pork. Make sure that all meats are cooked to "well done." Tofu. Tempeh. Beans. Eggs. Peanut butter and other nut butters. Seafood, such as shrimp, crab, and lobster. If you choose fish, select types that are higher in omega-3 fatty acids, including salmon, herring, mussels, trout, sardines, and pollock. Make sure that all meats are cooked to food-safe  temperatures. Dairy Pasteurized milk and milk alternatives. Pasteurized yogurt and pasteurized cheese. Cottage cheese. Sour cream. Beverages Water. Juices that contain 100% fruit juice or vegetable juice. Caffeine-free teas and decaffeinated coffee. Drinks that contain caffeine are okay to drink, but it is better to avoid caffeine. Keep your total caffeine intake to less than 200 mg each day (12 oz of coffee, tea, or soda) or as directed by your health care provider. Condiments Any pasteurized condiments. Sweets and Desserts Any sweets and desserts. Fats and Oils Any fats and oils. The items listed above may not be a complete list of recommended foods or beverages. Contact your dietitian for more options. What foods are not recommended? Vegetables Unpasteurized (raw) vegetable juices. Fruits Unpasteurized (raw) fruit juices. Meats and Other Protein Sources Cured meats that have nitrates, such as bacon, salami, and hotdogs. Luncheon meats, bologna, or other deli meats (unless they are reheated until they are steaming hot). Refrigerated pate, meat spreads from a meat counter, smoked seafood that is found in the refrigerated section of a store. Raw fish, such as sushi or sashimi. High mercury content fish, such as tilefish, shark, swordfish, and king mackerel. Raw meats, such as tuna or beef tartare. Undercooked meats and poultry. Make sure that all meats are cooked to food-safe temperatures. Dairy Unpasteurized (raw) milk and any foods that have raw milk in them. Soft cheeses, such as feta, queso blanco, queso fresco, Brie, Camembert cheeses, blue-veined cheeses, and Panela cheese (unless it is made with pasteurized milk, which must be stated on the label). Beverages Alcohol. Sugar-sweetened beverages, such as sodas, teas, or energy drinks. Condiments Homemade fermented foods and drinks, such as pickles, sauerkraut, or kombucha drinks. (Store-bought pasteurized versions of these are  okay.) Other Salads that are made in the store, such as ham salad, chicken salad, egg salad, tuna salad, and seafood salad. The items listed above may not be a complete list of foods and beverages to avoid. Contact your dietitian for more information. This information is not intended to replace advice given to you by your health care provider. Make sure you discuss any questions you have with your health care provider. Document Released: 09/05/2014 Document Revised: 04/28/2016 Document Reviewed: 05/06/2014 Elsevier Interactive Patient Education  2018 [employer] Prenatal Care WHAT IS PRENATAL CARE? Prenatal care is the process of caring for a pregnant woman before she gives birth. Prenatal care makes sure that she and her baby remain as healthy as possible throughout pregnancy. Prenatal care may be provided by a midwife, family practice health care provider, or a childbirth and pregnancy specialist (obstetrician). Prenatal care may include physical examinations, testing, treatments, and education on nutrition, lifestyle, and social support services. WHY IS PRENATAL CARE SO IMPORTANT? Early and consistent prenatal care increases the chance that you and your baby will remain healthy throughout your pregnancy. This type of care also decreases a baby's risk of being born too early (prematurely), or being born smaller than expected (small for gestational age). Any underlying medical conditions you may have that could pose a risk during your pregnancy are discussed during prenatal care visits. You will also be monitored regularly for any new   conditions that may arise during your pregnancy so they can be treated quickly and effectively. WHAT HAPPENS DURING PRENATAL CARE VISITS? Prenatal care visits may include the following: Discussion Tell your health care provider about any new signs or symptoms you have experienced since your last visit. These might include:  Nausea or vomiting.  Increased or  decreased level of energy.  Difficulty sleeping.  Back or leg pain.  Weight changes.  Frequent urination.  Shortness of breath with physical activity.  Changes in your skin, such as the development of a rash or itchiness.  Vaginal discharge or bleeding.  Feelings of excitement or nervousness.  Changes in your baby's movements.  You may want to write down any questions or topics you want to discuss with your health care provider and bring them with you to your appointment. Examination During your first prenatal care visit, you will likely have a complete physical exam. Your health care provider will often examine your vagina, cervix, and the position of your uterus, as well as check your heart, lungs, and other body systems. As your pregnancy progresses, your health care provider will measure the size of your uterus and your baby's position inside your uterus. He or she may also examine you for early signs of labor. Your prenatal visits may also include checking your blood pressure and, after about 10-12 weeks of pregnancy, listening to your baby's heartbeat. Testing Regular testing often includes:  Urinalysis. This checks your urine for glucose, protein, or signs of infection.  Blood count. This checks the levels of white and red blood cells in your body.  Tests for sexually transmitted infections (STIs). Testing for STIs at the beginning of pregnancy is routinely done and is required in many states.  Antibody testing. You will be checked to see if you are immune to certain illnesses, such as rubella, that can affect a developing fetus.  Glucose screen. Around 24-28 weeks of pregnancy, your blood glucose level will be checked for signs of gestational diabetes. Follow-up tests may be recommended.  Group B strep. This is a bacteria that is commonly found inside a woman's vagina. This test will inform your health care provider if you need an antibiotic to reduce the amount of this  bacteria in your body prior to labor and childbirth.  Ultrasound. Many pregnant women undergo an ultrasound screening around 18-20 weeks of pregnancy to evaluate the health of the fetus and check for any developmental abnormalities.  HIV (human immunodeficiency virus) testing. Early in your pregnancy, you will be screened for HIV. If you are at high risk for HIV, this test may be repeated during your third trimester of pregnancy.  You may be offered other testing based on your age, personal or family medical history, or other factors. HOW OFTEN SHOULD I PLAN TO SEE MY HEALTH CARE PROVIDER FOR PRENATAL CARE? Your prenatal care check-up schedule depends on any medical conditions you have before, or develop during, your pregnancy. If you do not have any underlying medical conditions, you will likely be seen for checkups:  Monthly, during the first 6 months of pregnancy.  Twice a month during months 7 and 8 of pregnancy.  Weekly starting in the 9th month of pregnancy and until delivery.  If you develop signs of early labor or other concerning signs or symptoms, you may need to see your health care provider more often. Ask your health care provider what prenatal care schedule is best for you. WHAT CAN I DO TO KEEP MYSELF AND   MY BABY AS HEALTHY AS POSSIBLE DURING MY PREGNANCY?  Take a prenatal vitamin containing 400 micrograms (0.4 mg) of folic acid every day. Your health care provider may also ask you to take additional vitamins such as iodine, vitamin D, iron, copper, and zinc.  Take 1500-2000 mg of calcium daily starting at your 20th week of pregnancy until you deliver your baby.  Make sure you are up to date on your vaccinations. Unless directed otherwise by your health care provider: ? You should receive a tetanus, diphtheria, and pertussis (Tdap) vaccination between the 27th and 36th week of your pregnancy, regardless of when your last Tdap immunization occurred. This helps protect your baby  from whooping cough (pertussis) after he or she is born. ? You should receive an annual inactivated influenza vaccine (IIV) to help protect you and your baby from influenza. This can be done at any point during your pregnancy.  Eat a well-rounded diet that includes: ? Fresh fruits and vegetables. ? Lean proteins. ? Calcium-rich foods such as milk, yogurt, hard cheeses, and dark, leafy greens. ? Whole grain breads.  Do noteat seafood high in mercury, including: ? Swordfish. ? Tilefish. ? Shark. ? King mackerel. ? More than 6 oz tuna per week.  Do not eat: ? Raw or undercooked meats or eggs. ? Unpasteurized foods, such as soft cheeses (brie, blue, or feta), juices, and milks. ? Lunch meats. ? Hot dogs that have not been heated until they are steaming.  Drink enough water to keep your urine clear or pale yellow. For many women, this may be 10 or more 8 oz glasses of water each day. Keeping yourself hydrated helps deliver nutrients to your baby and may prevent the start of pre-term uterine contractions.  Do not use any tobacco products including cigarettes, chewing tobacco, or electronic cigarettes. If you need help quitting, ask your health care provider.  Do not drink beverages containing alcohol. No safe level of alcohol consumption during pregnancy has been determined.  Do not use any illegal drugs. These can harm your developing baby or cause a miscarriage.  Ask your health care provider or pharmacist before taking any prescription or over-the-counter medicines, herbs, or supplements.  Limit your caffeine intake to no more than 200 mg per day.  Exercise. Unless told otherwise by your health care provider, try to get 30 minutes of moderate exercise most days of the week. Do not  do high-impact activities, contact sports, or activities with a high risk of falling, such as horseback riding or downhill skiing.  Get plenty of rest.  Avoid anything that raises your body temperature,  such as hot tubs and saunas.  If you own a cat, do not empty its litter box. Bacteria contained in cat feces can cause an infection called toxoplasmosis. This can result in serious harm to the fetus.  Stay away from chemicals such as insecticides, lead, mercury, and cleaning or paint products that contain solvents.  Do not have any X-rays taken unless medically necessary.  Take a childbirth and breastfeeding preparation class. Ask your health care provider if you need a referral or recommendation.  This information is not intended to replace advice given to you by your health care provider. Make sure you discuss any questions you have with your health care provider. Document Released: 11/24/2003 Document Revised: 04/25/2016 Document Reviewed: 02/05/2014 Elsevier Interactive Patient Education  2017 [employer]  

## 2018-07-09 NOTE — Progress Notes (Signed)
Subjective:    Ericka PontiffKacie N Loftis is a 27 y.o. female who presents for evaluation of amenorrhea. She believes she could be pregnant. Pregnancy is desired. Sexual Activity: single partner, contraception: none. Current symptoms also include: breast tenderness and nausea. Last period was normal.   No LMP recorded. The following portions of the patient's history were reviewed and updated as appropriate: allergies, current medications, past family history, past medical history, past social history, past surgical history and problem list.  Review of Systems Pertinent items are noted in HPI.     Objective:    There were no vitals taken for this visit. General: alert, cooperative, appears stated age and no acute distress    Lab Review Urine HCG: positive    Assessment:    Absence of menstruation.     Plan:   Positive: EDC: 03/01/19. Briefly discussed pre-natal care options.. Encouraged well-balanced diet, plenty of rest when needed, pre-natal vitamins daily and walking for exercise. Discussed self-help for nausea, avoiding OTC medications until consulting provider or pharmacist, other than Tylenol as needed, minimal caffeine (1-2 cups daily) and avoiding alcohol. She will schedule her initial nurse OB visit @ 10 wks and NOB physical exam @ 12 wks.  Doreene BurkeAnnie Dinita Migliaccio, CNM

## 2018-07-16 ENCOUNTER — Other Ambulatory Visit: Payer: Self-pay | Admitting: Internal Medicine

## 2018-07-17 ENCOUNTER — Other Ambulatory Visit: Payer: Self-pay

## 2018-07-17 MED ORDER — ONDANSETRON 4 MG PO TBDP: 4.0000 mg | ORAL_TABLET | Freq: Four times a day (QID) | ORAL | 1 refills | Status: DC | PRN

## 2018-08-10 ENCOUNTER — Ambulatory Visit: Payer: BLUE CROSS/BLUE SHIELD | Admitting: Certified Nurse Midwife

## 2018-08-10 VITALS — BP 114/76 | HR 91 | Wt 140.4 lb

## 2018-08-10 DIAGNOSIS — Z3401 Encounter for supervision of normal first pregnancy, first trimester: Secondary | ICD-10-CM | POA: Diagnosis not present

## 2018-08-10 NOTE — Progress Notes (Signed)
Ericka Pontiff Loftis presents for NOB nurse interview visit. Pregnancy confirmation done 8/5/19_ [facility]_____.  G- 1.  P-  0  . Pregnancy education material explained and given. _0__ cats in the home. NOB labs ordered.  HIV labs and Drug screen were explained optional and she did not decline. Drug screen ordered. PNV encouraged. Genetic screening options discussed. Genetic testing: Ordered.  Pt may discuss with provider. Pt. To follow up with provider in 1-2__ weeks for NOB physical.  All questions answered. [facility] financial policy given to pt and reviewed. [facility] form given and signed by pt.

## 2018-08-11 LAB — URINALYSIS, ROUTINE W REFLEX MICROSCOPIC
BILIRUBIN UA: NEGATIVE
KETONES UA: NEGATIVE
LEUKOCYTES UA: NEGATIVE
NITRITE UA: NEGATIVE
PH UA: 6.5 (ref 5.0–7.5)
Protein, UA: NEGATIVE
RBC UA: NEGATIVE
SPEC GRAV UA: 1.019 (ref 1.005–1.030)
Urobilinogen, Ur: 0.2 mg/dL (ref 0.2–1.0)

## 2018-08-11 LAB — MONITOR DRUG PROFILE 14(MW)
Amphetamine Scrn, Ur: NEGATIVE ng/mL
BARBITURATE SCREEN URINE: NEGATIVE ng/mL
BENZODIAZEPINE SCREEN, URINE: NEGATIVE ng/mL
BUPRENORPHINE, URINE: NEGATIVE ng/mL
CANNABINOIDS UR QL SCN: NEGATIVE ng/mL
COCAINE(METAB.)SCREEN, URINE: NEGATIVE ng/mL
CREATININE(CRT), U: 67.8 mg/dL (ref 20.0–300.0)
Fentanyl, Urine: NEGATIVE pg/mL
MEPERIDINE SCREEN, URINE: NEGATIVE ng/mL
METHADONE SCREEN, URINE: NEGATIVE ng/mL
OPIATE SCREEN URINE: NEGATIVE ng/mL
OXYCODONE+OXYMORPHONE UR QL SCN: NEGATIVE ng/mL
PROPOXYPHENE SCREEN URINE: NEGATIVE ng/mL
Ph of Urine: 6.7 (ref 4.5–8.9)
Phencyclidine Qn, Ur: NEGATIVE ng/mL
SPECIFIC GRAVITY: 1.02
TRAMADOL SCREEN, URINE: NEGATIVE ng/mL

## 2018-08-11 LAB — GC/CHLAMYDIA PROBE AMP
Chlamydia trachomatis, NAA: NEGATIVE
NEISSERIA GONORRHOEAE BY PCR: NEGATIVE

## 2018-08-11 LAB — NICOTINE SCREEN, URINE: Cotinine Ql Scrn, Ur: NEGATIVE ng/mL

## 2018-08-12 LAB — URINE CULTURE

## 2018-08-13 LAB — CBC WITH DIFFERENTIAL
BASOS: 0 %
Basophils Absolute: 0 10*3/uL (ref 0.0–0.2)
EOS (ABSOLUTE): 0 10*3/uL (ref 0.0–0.4)
Eos: 1 %
HEMOGLOBIN: 13.3 g/dL (ref 11.1–15.9)
Hematocrit: 37.9 % (ref 34.0–46.6)
IMMATURE GRANS (ABS): 0 10*3/uL (ref 0.0–0.1)
Immature Granulocytes: 0 %
LYMPHS: 21 %
Lymphocytes Absolute: 1.4 10*3/uL (ref 0.7–3.1)
MCH: 32.2 pg (ref 26.6–33.0)
MCHC: 35.1 g/dL (ref 31.5–35.7)
MCV: 92 fL (ref 79–97)
MONOCYTES: 7 %
Monocytes Absolute: 0.5 10*3/uL (ref 0.1–0.9)
NEUTROS ABS: 4.7 10*3/uL (ref 1.4–7.0)
Neutrophils: 71 %
RBC: 4.13 x10E6/uL (ref 3.77–5.28)
RDW: 11.4 % — ABNORMAL LOW (ref 12.3–15.4)
WBC: 6.6 10*3/uL (ref 3.4–10.8)

## 2018-08-13 LAB — SICKLE CELL SCREEN: Sickle Cell Screen: NEGATIVE

## 2018-08-13 LAB — VARICELLA ZOSTER ANTIBODY, IGG: Varicella zoster IgG: 350 index (ref >165)

## 2018-08-13 LAB — HIV ANTIBODY (ROUTINE TESTING W REFLEX): HIV SCREEN 4TH GENERATION: NONREACTIVE

## 2018-08-13 LAB — ABO AND RH: Rh Factor: POSITIVE

## 2018-08-13 LAB — RUBELLA SCREEN: Rubella Antibodies, IGG: 3.16 index (ref >0.99)

## 2018-08-13 LAB — HEPATITIS B SURFACE ANTIGEN: HEP B S AG: NEGATIVE

## 2018-08-13 LAB — ANTIBODY SCREEN: Antibody Screen: NEGATIVE

## 2018-08-13 LAB — RPR: RPR: NONREACTIVE

## 2018-08-20 ENCOUNTER — Encounter: Payer: Self-pay | Admitting: Certified Nurse Midwife

## 2018-08-20 ENCOUNTER — Other Ambulatory Visit (HOSPITAL_COMMUNITY)
Admission: RE | Admit: 2018-08-20 | Discharge: 2018-08-20 | Disposition: A | Discharge status: Home / Self Care | Payer: BLUE CROSS/BLUE SHIELD | Source: Ambulatory Visit | Attending: Obstetrics and Gynecology | Admitting: Obstetrics and Gynecology

## 2018-08-20 ENCOUNTER — Ambulatory Visit (INDEPENDENT_AMBULATORY_CARE_PROVIDER_SITE_OTHER): Payer: BLUE CROSS/BLUE SHIELD | Admitting: Certified Nurse Midwife

## 2018-08-20 VITALS — BP 123/78 | HR 111 | Wt 140.3 lb

## 2018-08-20 DIAGNOSIS — Z3401 Encounter for supervision of normal first pregnancy, first trimester: Secondary | ICD-10-CM

## 2018-08-20 DIAGNOSIS — M545 Low back pain, unspecified: Secondary | ICD-10-CM

## 2018-08-20 DIAGNOSIS — Z124 Encounter for screening for malignant neoplasm of cervix: Secondary | ICD-10-CM

## 2018-08-20 DIAGNOSIS — Z3A12 12 weeks gestation of pregnancy: Secondary | ICD-10-CM

## 2018-08-20 NOTE — Patient Instructions (Signed)
Common Medications Safe in Pregnancy  Acne:      Constipation:  Benzoyl Peroxide     Colace  Clindamycin      Dulcolax Suppository  Topica Erythromycin     Fibercon  Salicylic Acid      Metamucil         Miralax AVOID:        Senakot   Accutane    Cough:  Retin-A       Cough Drops  Tetracycline      Phenergan w/ Codeine if Rx  Minocycline      Robitussin (Plain & DM)  Antibiotics:     Crabs/Lice:  Ceclor       RID  Cephalosporins    AVOID:  E-Mycins      Kwell  Keflex  Macrobid/Macrodantin   Diarrhea:  Penicillin      Kao-Pectate  Zithromax      Imodium AD         PUSH FLUIDS AVOID:       Cipro     Fever:  Tetracycline      Tylenol (Regular or Extra  Minocycline       Strength)  Levaquin      Extra Strength-Do not          Exceed 8 tabs/24 hrs Caffeine:        <200mg/day (equiv. To 1 cup of coffee or  approx. 3 12 oz sodas)         Gas: Cold/Hayfever:       Gas-X  Benadryl      Mylicon  Claritin       Phazyme  **Claritin-D        Chlor-Trimeton    Headaches:  Dimetapp      ASA-Free Excedrin  Drixoral-Non-Drowsy     Cold Compress  Mucinex (Guaifenasin)     Tylenol (Regular or Extra  Sudafed/Sudafed-12 Hour     Strength)  **Sudafed PE Pseudoephedrine   Tylenol Cold & Sinus     Vicks Vapor Rub  Zyrtec  **AVOID if Problems With Blood Pressure         Heartburn: Avoid lying down for at least 1 hour after meals  Aciphex      Maalox     Rash:  Milk of Magnesia     Benadryl    Mylanta       1% Hydrocortisone Cream  Pepcid  Pepcid Complete   Sleep Aids:  Prevacid      Ambien   Prilosec       Benadryl  Rolaids       Chamomile Tea  Tums (Limit 4/day)     Unisom  Zantac       Tylenol PM         Warm milk-add vanilla or  Hemorrhoids:       Sugar for taste  Anusol/Anusol H.C.  (RX: Analapram 2.5%)  Sugar Substitutes:  Hydrocortisone OTC     Ok in moderation  Preparation H      Tucks        Vaseline lotion applied to tissue with  wiping    Herpes:     Throat:  Acyclovir      Oragel  Famvir  Valtrex     Vaccines:         Flu Shot Leg Cramps:       *Gardasil  Benadryl      Hepatitis A         Hepatitis B Nasal Spray:         Pneumovax  Saline Nasal Spray     Polio Booster         Tetanus Nausea:       Tuberculosis test or PPD  Vitamin B6 25 mg TID   AVOID:    Dramamine      *Gardasil  Emetrol       Live Poliovirus  Ginger Root 250 mg QID    MMR (measles, mumps &  High Complex Carbs @ Bedtime    rebella)  Sea Bands-Accupressure    Varicella (Chickenpox)  Unisom 1/2 tab TID     *No known complications           If received before Pain:         Known pregnancy;   Darvocet       Resume series after  Lortab        Delivery  Percocet    Yeast:   Tramadol      Femstat  Tylenol 3      Gyne-lotrimin  Ultram       Monistat  Vicodin           MISC:         All Sunscreens           Hair Coloring/highlights          Insect Repellant's          (Including DEET)         Mystic Tans Eating Plan for Pregnant Women While you are pregnant, your body will require additional nutrition to help support your growing baby. It is recommended that you consume:  150 additional calories each day during your first trimester.  300 additional calories each day during your second trimester.  300 additional calories each day during your third trimester.  Eating a healthy, well-balanced diet is very important for your health and for your baby's health. You also have a higher need for some vitamins and minerals, such as folic acid, calcium, iron, and vitamin D. What do I need to know about eating during pregnancy?  Do not try to lose weight or go on a diet during pregnancy.  Choose healthy, nutritious foods. Choose  of a sandwich with a glass of milk instead of a candy bar or a high-calorie sugar-sweetened beverage.  Limit your overall intake of foods that have "empty calories." These are foods that have little nutritional  value, such as sweets, desserts, candies, sugar-sweetened beverages, and fried foods.  Eat a variety of foods, especially fruits and vegetables.  Take a prenatal vitamin to help meet the additional needs during pregnancy, specifically for folic acid, iron, calcium, and vitamin D.  Remember to stay active. Ask your health care provider for exercise recommendations that are specific to you.  Practice good food safety and cleanliness, such as washing your hands before you eat and after you prepare raw meat. This helps to prevent foodborne illnesses, such as listeriosis, that can be very dangerous for your baby. Ask your health care provider for more information about listeriosis. What does 150 extra calories look like? Healthy options for an additional 150 calories each day could be any of the following:  Plain low-fat yogurt (6-8 oz) with  cup of berries.  1 apple with 2 teaspoons of peanut butter.  Cut-up vegetables with  cup of hummus.  Low-fat chocolate milk (8 oz or 1 cup).  1 string cheese with 1 medium orange.   of a peanut butter and jelly sandwich on whole-wheat bread (1   tsp of peanut butter).  For 300 calories, you could eat two of those healthy options each day. What is a healthy amount of weight to gain? The recommended amount of weight for you to gain is based on your pre-pregnancy BMI. If your pre-pregnancy BMI was:  Less than 18 (underweight), you should gain 28-40 lb.  18-24.9 (normal), you should gain 25-35 lb.  25-29.9 (overweight), you should gain 15-25 lb.  Greater than 30 (obese), you should gain 11-20 lb.  What if I am having twins or multiples? Generally, pregnant women who will be having twins or multiples may need to increase their daily calories by 300-600 calories each day. The recommended range for total weight gain is 25-54 lb, depending on your pre-pregnancy BMI. Talk with your health care provider for specific guidance about additional nutritional  needs, weight gain, and exercise during your pregnancy. What foods can I eat? Grains Any grains. Try to choose whole grains, such as whole-wheat bread, oatmeal, or brown rice. Vegetables Any vegetables. Try to eat a variety of colors and types of vegetables to get a full range of vitamins and minerals. Remember to wash your vegetables well before eating. Fruits Any fruits. Try to eat a variety of colors and types of fruit to get a full range of vitamins and minerals. Remember to wash your fruits well before eating. Meats and Other Protein Sources Lean meats, including chicken, [place], fish, and lean cuts of beef, veal, or pork. Make sure that all meats are cooked to "well done." Tofu. Tempeh. Beans. Eggs. Peanut butter and other nut butters. Seafood, such as shrimp, crab, and lobster. If you choose fish, select types that are higher in omega-3 fatty acids, including salmon, herring, mussels, trout, sardines, and pollock. Make sure that all meats are cooked to food-safe temperatures. Dairy Pasteurized milk and milk alternatives. Pasteurized yogurt and pasteurized cheese. Cottage cheese. Sour cream. Beverages Water. Juices that contain 100% fruit juice or vegetable juice. Caffeine-free teas and decaffeinated coffee. Drinks that contain caffeine are okay to drink, but it is better to avoid caffeine. Keep your total caffeine intake to less than 200 mg each day (12 oz of coffee, tea, or soda) or as directed by your health care provider. Condiments Any pasteurized condiments. Sweets and Desserts Any sweets and desserts. Fats and Oils Any fats and oils. The items listed above may not be a complete list of recommended foods or beverages. Contact your dietitian for more options. What foods are not recommended? Vegetables Unpasteurized (raw) vegetable juices. Fruits Unpasteurized (raw) fruit juices. Meats and Other Protein Sources Cured meats that have nitrates, such as bacon, salami, and hotdogs.  Luncheon meats, bologna, or other deli meats (unless they are reheated until they are steaming hot). Refrigerated pate, meat spreads from a meat counter, smoked seafood that is found in the refrigerated section of a store. Raw fish, such as sushi or sashimi. High mercury content fish, such as tilefish, shark, swordfish, and king mackerel. Raw meats, such as tuna or beef tartare. Undercooked meats and poultry. Make sure that all meats are cooked to food-safe temperatures. Dairy Unpasteurized (raw) milk and any foods that have raw milk in them. Soft cheeses, such as feta, queso blanco, queso fresco, Brie, Camembert cheeses, blue-veined cheeses, and Panela cheese (unless it is made with pasteurized milk, which must be stated on the label). Beverages Alcohol. Sugar-sweetened beverages, such as sodas, teas, or energy drinks. Condiments Homemade fermented foods and drinks, such as pickles, sauerkraut, or kombucha drinks. (Store-bought  pasteurized versions of these are okay.) Other Salads that are made in the store, such as ham salad, chicken salad, egg salad, tuna salad, and seafood salad. The items listed above may not be a complete list of foods and beverages to avoid. Contact your dietitian for more information. This information is not intended to replace advice given to you by your health care provider. Make sure you discuss any questions you have with your health care provider. Document Released: 09/05/2014 Document Revised: 04/28/2016 Document Reviewed: 05/06/2014 Elsevier Interactive Patient Education  2018 [employer]  

## 2018-08-20 NOTE — Progress Notes (Signed)
NEW OB HISTORY AND PHYSICAL  SUBJECTIVE:       Bethany Gutierrez is a 27 y.o. G1P0 female, Patient's last menstrual period was 05/25/2018 (exact date)., Estimated Date of Delivery: 03/01/19, [redacted]w[redacted]d, presents today for establishment of Prenatal Care. She has no unusual complaints    Gynecologic History Patient's last menstrual period was 05/25/2018 (exact date). Normal Contraception: none Last Pap: a few yrs ago. Results were: normal  Obstetric History OB History  Gravida Para Term Preterm AB Living  1            SAB TAB Ectopic Multiple Live Births               # Outcome Date GA Lbr Len/2nd Weight Sex Delivery Anes PTL Lv  1 Current             Past Medical History:  Diagnosis Date  . Asthma   . Seasonal allergies     Past Surgical History:  Procedure Laterality Date  . TONSILLECTOMY      Current Outpatient Medications on File Prior to Visit  Medication Sig Dispense Refill  . azelastine (ASTELIN) 0.1 % nasal spray 1 SPRAY EACH NOSTRIL EVERY EVENING    . fluticasone (FLONASE) 50 MCG/ACT nasal spray USE 1 SPRAY IN EACH NOSTRIL EVERY DAY (Patient not taking: Reported on 08/10/2018) 16 g 2  . Fluticasone-Salmeterol (ADVAIR) 250-50 MCG/DOSE AEPB Inhale 1 puff into the lungs 2 (two) times daily.    Marland Kitchen levocetirizine (XYZAL) 5 MG tablet Take 5 mg by mouth every evening.    . montelukast (SINGULAIR) 10 MG tablet Take 10 mg by mouth at bedtime.    . ondansetron (ZOFRAN ODT) 4 MG disintegrating tablet Take 1 tablet (4 mg total) by mouth every 6 (six) hours as needed for nausea. 20 tablet 1  . Prenatal Vit-Fe Fumarate-FA (PRENATAL MULTIVITAMIN) TABS tablet Take 1 tablet by mouth daily at 12 noon.     No current facility-administered medications on file prior to visit.     Allergies  Allergen Reactions  . Levofloxacin Other (See Comments)    She states it kept her up all night and gave her a really bad migraine.  . Bacitracin Hives  . Codeine Nausea And Vomiting    rash  .  Prednisone Other (See Comments)    Sensative Sensative   . Sulfamethoxazole-Trimethoprim Nausea And Vomiting and Other (See Comments)    Nausea and vomiting Nausea and vomiting   . Tramadol Nausea And Vomiting  . Vicodin [Hydrocodone-Acetaminophen] Nausea And Vomiting and Rash    Social History   Socioeconomic History  . Marital status: Married    Spouse name: Not on file  . Number of children: Not on file  . Years of education: Not on file  . Highest education level: Not on file  Occupational History  . Not on file  Social Needs  . Financial resource strain: Not on file  . Food insecurity:    Worry: Not on file    Inability: Not on file  . Transportation needs:    Medical: Not on file    Non-medical: Not on file  Tobacco Use  . Smoking status: Never Smoker  . Smokeless tobacco: Never Used  Substance and Sexual Activity  . Alcohol use: No  . Drug use: No  . Sexual activity: Yes    Birth control/protection: None  Lifestyle  . Physical activity:    Days per week: Not on file    Minutes per session: Not  on file  . Stress: Not on file  Relationships  . Social connections:    Talks on phone: Not on file    Gets together: Not on file    Attends religious service: Not on file    Active member of club or organization: Not on file    Attends meetings of clubs or organizations: Not on file    Relationship status: Not on file  . Intimate partner violence:    Fear of current or ex partner: Not on file    Emotionally abused: Not on file    Physically abused: Not on file    Forced sexual activity: Not on file  Other Topics Concern  . Not on file  Social History Narrative  . Not on file    No family history on file.  The following portions of the patient's history were reviewed and updated as appropriate: allergies, current medications, past OB history, past medical history, past surgical history, past family history, past social history, and problem  list.    OBJECTIVE: Initial Physical Exam (New OB)  GENERAL APPEARANCE: alert, well appearing, in no apparent distress, oriented to person, place and time HEAD: normocephalic, atraumatic MOUTH: mucous membranes moist, pharynx normal without lesions THYROID: no thyromegaly or masses present BREASTS: no masses noted, no significant tenderness, no palpable axillary nodes, no skin changes. Nipples mild inverted bilaterally.  LUNGS: clear to auscultation, no wheezes, rales or rhonchi, symmetric air entry HEART: regular rate and rhythm, no murmurs ABDOMEN: soft, nontender, nondistended, no abnormal masses, no epigastric pain, fundus soft, nontender 12 weeks size and FHT present EXTREMITIES: no redness or tenderness in the calves or thighs, no edema, no limitation in range of motion, intact peripheral pulses SKIN: normal coloration and turgor, no rashes LYMPH NODES: no adenopathy palpable NEUROLOGIC: alert, oriented, normal speech, no focal findings or movement disorder noted  PELVIC EXAM EXTERNAL GENITALIA: normal appearing vulva with no masses, tenderness or lesions VAGINA: no abnormal discharge or lesions CERVIX: no lesions or cervical motion tenderness UTERUS: gravid ADNEXA: no masses palpable and nontender OB EXAM PELVIMETRY: appears adequate RECTUM: exam not indicated  ASSESSMENT: Normal pregnancy  PLAN: New OB counseling: The patient has been given an overview regarding routine prenatal care. Recommendations regarding diet, weight gain, and exercise in pregnancy were given. Prenatal testing, optional genetic testing, and ultrasound use in pregnancy were reviewed. Panorama test completed. Benefits of Breast Feeding were discussed. The patient is encouraged to consider nursing her baby post partum.  Doreene BurkeAnnie Evalyn Shultis, CNM

## 2018-08-21 LAB — CYTOLOGY - PAP: DIAGNOSIS: NEGATIVE

## 2018-09-19 ENCOUNTER — Ambulatory Visit (INDEPENDENT_AMBULATORY_CARE_PROVIDER_SITE_OTHER): Payer: BLUE CROSS/BLUE SHIELD | Admitting: Obstetrics and Gynecology

## 2018-09-19 VITALS — BP 107/75 | HR 90 | Wt 146.2 lb

## 2018-09-19 DIAGNOSIS — Z3492 Encounter for supervision of normal pregnancy, unspecified, second trimester: Secondary | ICD-10-CM

## 2018-09-19 DIAGNOSIS — Z23 Encounter for immunization: Secondary | ICD-10-CM | POA: Diagnosis not present

## 2018-09-19 LAB — POCT URINALYSIS DIPSTICK OB
Bilirubin, UA: NEGATIVE
Glucose, UA: NEGATIVE
Ketones, UA: NEGATIVE
LEUKOCYTES UA: NEGATIVE
Nitrite, UA: NEGATIVE
POC,PROTEIN,UA: NEGATIVE
RBC UA: NEGATIVE
Spec Grav, UA: 1.01 (ref 1.010–1.025)
Urobilinogen, UA: 0.2 E.U./dL
pH, UA: 6.5 (ref 5.0–8.0)

## 2018-09-19 MED ORDER — FLUTICASONE PROPIONATE 50 MCG/ACT NA SUSP: NASAL | 2 refills | Status: DC

## 2018-09-19 NOTE — Progress Notes (Signed)
ROB- doing well, flu vaccine given, anatomy scan next visit. Discussed classes and schedule given.

## 2018-09-19 NOTE — Progress Notes (Signed)
ROB- pt is doing well, flu vaccine given 

## 2018-09-28 ENCOUNTER — Telehealth: Payer: Self-pay

## 2018-09-28 ENCOUNTER — Other Ambulatory Visit: Payer: BLUE CROSS/BLUE SHIELD

## 2018-09-28 ENCOUNTER — Telehealth: Payer: Self-pay | Admitting: Certified Nurse Midwife

## 2018-09-28 ENCOUNTER — Other Ambulatory Visit: Payer: Self-pay

## 2018-09-28 DIAGNOSIS — Z3492 Encounter for supervision of normal pregnancy, unspecified, second trimester: Secondary | ICD-10-CM | POA: Diagnosis not present

## 2018-09-28 DIAGNOSIS — M545 Low back pain, unspecified: Secondary | ICD-10-CM

## 2018-09-28 NOTE — Addendum Note (Signed)
Addended by: Brooke Dare on: 09/28/2018 02:45 PM   Modules accepted: Orders

## 2018-09-28 NOTE — Telephone Encounter (Signed)
Returned pts call- states she has had some dysuria and backache. Denies spotting or cramping. States she has not taken any tylenol. Advised tylenol, heat to back and hydrate. She will stop by the office to leave a urine specimen at 1:30.

## 2018-09-28 NOTE — Telephone Encounter (Signed)
The patient called and stated that she would like to speak with a nurse in regards to her having abdominal dicomfort and lower back pain. The patient did not disclose any other information. Please advise.   *Patient is dropping off a urine today around lunch time*

## 2018-09-30 LAB — URINE CULTURE

## 2018-10-01 ENCOUNTER — Encounter (HOSPITAL_COMMUNITY): Payer: Self-pay

## 2018-10-01 ENCOUNTER — Encounter: Payer: BLUE CROSS/BLUE SHIELD | Admitting: Certified Nurse Midwife

## 2018-10-01 NOTE — Addendum Note (Signed)
Addended by: Brooke Dare on: 10/01/2018 08:05 AM   Modules accepted: Orders

## 2018-10-03 ENCOUNTER — Other Ambulatory Visit: Payer: Self-pay

## 2018-10-03 MED ORDER — MONTELUKAST SODIUM 10 MG PO TABS: 10.0000 mg | ORAL_TABLET | Freq: Every day | ORAL | 3 refills | Status: DC

## 2018-10-03 MED ORDER — LEVOCETIRIZINE DIHYDROCHLORIDE 5 MG PO TABS: 5.0000 mg | ORAL_TABLET | Freq: Every evening | ORAL | 3 refills | Status: DC

## 2018-10-03 NOTE — Telephone Encounter (Signed)
Refills sent per pt request. Pt informed

## 2018-10-09 ENCOUNTER — Other Ambulatory Visit: Payer: Self-pay | Admitting: Obstetrics and Gynecology

## 2018-10-09 DIAGNOSIS — Z3689 Encounter for other specified antenatal screening: Secondary | ICD-10-CM

## 2018-10-11 ENCOUNTER — Other Ambulatory Visit: Payer: Self-pay | Admitting: Internal Medicine

## 2018-10-16 ENCOUNTER — Ambulatory Visit (INDEPENDENT_AMBULATORY_CARE_PROVIDER_SITE_OTHER): Payer: BLUE CROSS/BLUE SHIELD | Admitting: Certified Nurse Midwife

## 2018-10-16 ENCOUNTER — Ambulatory Visit (INDEPENDENT_AMBULATORY_CARE_PROVIDER_SITE_OTHER): Payer: BLUE CROSS/BLUE SHIELD

## 2018-10-16 ENCOUNTER — Encounter: Payer: BLUE CROSS/BLUE SHIELD | Admitting: Certified Nurse Midwife

## 2018-10-16 ENCOUNTER — Encounter: Payer: Self-pay | Admitting: Certified Nurse Midwife

## 2018-10-16 VITALS — BP 95/67 | HR 97 | Wt 149.3 lb

## 2018-10-16 DIAGNOSIS — Z363 Encounter for antenatal screening for malformations: Secondary | ICD-10-CM

## 2018-10-16 DIAGNOSIS — Z3492 Encounter for supervision of normal pregnancy, unspecified, second trimester: Secondary | ICD-10-CM | POA: Diagnosis not present

## 2018-10-16 DIAGNOSIS — Z3689 Encounter for other specified antenatal screening: Secondary | ICD-10-CM

## 2018-10-16 LAB — POCT URINALYSIS DIPSTICK OB
Bilirubin, UA: NEGATIVE
GLUCOSE, UA: NEGATIVE
Ketones, UA: NEGATIVE
LEUKOCYTES UA: NEGATIVE
NITRITE UA: NEGATIVE
PH UA: 6 (ref 5.0–8.0)
PROTEIN: NEGATIVE
RBC UA: NEGATIVE
Spec Grav, UA: <=1.005 — AB (ref 1.010–1.025)
UROBILINOGEN UA: 0.2 U/dL

## 2018-10-16 NOTE — Progress Notes (Signed)
ROB, no complaints.  

## 2018-10-16 NOTE — Progress Notes (Signed)
ROB-Doing well, no questions or concerns. Anatomy scan today complete and normal; findings reviewed with patient and spouse, verbalized understanding. Anticipatory guidance regarding course of prenatal care. Handouts given: Water quality scientist After [employer] and [facility] Volunteer [employer] Reviewed red flag symptoms and when to call. RTC x 4 weeks for ROB or sooner if needed.   ULTRASOUND REPORT  Location: [facility] Date of Service:  10/16/2018  Indications: Anatomy Findings:  Singleton intrauterine pregnancy is visualized with FHR at 145 BPM. Biometrics give an (U/S) Gestational age of 60 1/7 weeks and an (U/S) EDD of 03/04/19; this correlates with the clinically established EDD of 03/01/19.  Fetal presentation is breech.  EFW: 340 grams (0lb 12oz). Placenta: Anterior and grade 1. AFI: WNL subjectively.  Anatomic survey is complete and appears WNL; Gender - Female.   Right Ovary measures 2.1 x 1.6 x 1.2 cm. It is normal in appearance. Left Ovary measures 2.4 x 1.8 x 1.7 cm. It is normal appearance. There is no obvious evidence of a corpus luteal cyst. Survey of the adnexa demonstrates no adnexal masses. There is no free peritoneal fluid in the cul de sac.  Impression: 1. 20 1/7 week Viable Singleton Intrauterine pregnancy by U/S. 2. (U/S) EDD is consistent with Clinically established (LMP) EDD of 03/01/19. 3. Normal Anatomy Scan  Recommendations: 1.Clinical correlation with the patient's History and Physical Exam.

## 2018-10-16 NOTE — Patient Instructions (Signed)
[facility]  [place], [place], [place] [address]  Phone: [phone]   [facility] (second location)  [address] [place], [place] [address]  Phone: [phone]   [facility]) [place], [place], [place] [address] Phone: [phone]   [address] [place], [place] [address]  Phone: [phone]  Breastfeeding Choosing to breastfeed is one of the best decisions you can make for yourself and your baby. A change in hormones during pregnancy causes your breasts to make breast milk in your milk-producing glands. Hormones prevent breast milk from being released before your baby is born. They also prompt milk flow after birth. Once breastfeeding has begun, thoughts of your baby, as well as his or her sucking or crying, can stimulate the release of milk from your milk-producing glands. Benefits of breastfeeding Research shows that breastfeeding offers many health benefits for infants and mothers. It also offers a cost-free and convenient way to feed your baby. For your baby  Your first milk (colostrum) helps your baby's digestive system to function better.  Special cells in your milk (antibodies) help your baby to fight off infections.  Breastfed babies are less likely to develop asthma, allergies, obesity, or type 2 diabetes. They are also at lower risk for sudden infant death syndrome (SIDS).  Nutrients in breast milk are better able to meet your baby's needs compared to infant formula.  Breast milk improves your baby's brain development. For you  Breastfeeding helps to create a very special bond between you and your baby.  Breastfeeding is convenient. Breast milk costs nothing and is always available at the correct temperature.  Breastfeeding helps to burn calories. It helps you to lose the weight that you gained during pregnancy.  Breastfeeding makes your  uterus return faster to its size before pregnancy. It also slows bleeding (lochia) after you give birth.  Breastfeeding helps to lower your risk of developing type 2 diabetes, osteoporosis, rheumatoid arthritis, cardiovascular disease, and breast, ovarian, uterine, and endometrial cancer later in life. Breastfeeding basics Starting breastfeeding  Find a comfortable place to sit or lie down, with your neck and back well-supported.  Place a pillow or a rolled-up blanket under your baby to bring him or her to the level of your breast (if you are seated). Nursing pillows are specially designed to help support your arms and your baby while you breastfeed.  Make sure that your baby's tummy (abdomen) is facing your abdomen.  Gently massage your breast. With your fingertips, massage from the outer edges of your breast inward toward the nipple. This encourages milk flow. If your milk flows slowly, you may need to continue this action during the feeding.  Support your breast with 4 fingers underneath and your thumb above your nipple (make the letter "C" with your hand). Make sure your fingers are well away from your nipple and your baby's mouth.  Stroke your baby's lips gently with your finger or nipple.  When your baby's mouth is open wide enough, quickly bring your baby to your breast, placing your entire nipple and as much of the areola as possible into your baby's mouth. The areola is the colored area around your nipple. ? More areola should be visible above your baby's upper lip than below the lower lip. ? Your baby's lips should be opened and extended outward (flanged) to ensure an adequate, comfortable latch. ? Your baby's tongue should be between his  or her lower gum and your breast.  Make sure that your baby's mouth is correctly positioned around your nipple (latched). Your baby's lips should create a seal on your breast and be turned out (everted).  It is common for your baby to suck about  2-3 minutes in order to start the flow of breast milk. Latching Teaching your baby how to latch onto your breast properly is very important. An improper latch can cause nipple pain, decreased milk supply, and poor weight gain in your baby. Also, if your baby is not latched onto your nipple properly, he or she may swallow some air during feeding. This can make your baby fussy. Burping your baby when you switch breasts during the feeding can help to get rid of the air. However, teaching your baby to latch on properly is still the best way to prevent fussiness from swallowing air while breastfeeding. Signs that your baby has successfully latched onto your nipple  Silent tugging or silent sucking, without causing you pain. Infant's lips should be extended outward (flanged).  Swallowing heard between every 3-4 sucks once your milk has started to flow (after your let-down milk reflex occurs).  Muscle movement above and in front of his or her ears while sucking.  Signs that your baby has not successfully latched onto your nipple  Sucking sounds or smacking sounds from your baby while breastfeeding.  Nipple pain.  If you think your baby has not latched on correctly, slip your finger into the corner of your baby's mouth to break the suction and place it between your baby's gums. Attempt to start breastfeeding again. Signs of successful breastfeeding Signs from your baby  Your baby will gradually decrease the number of sucks or will completely stop sucking.  Your baby will fall asleep.  Your baby's body will relax.  Your baby will retain a small amount of milk in his or her mouth.  Your baby will let go of your breast by himself or herself.  Signs from you  Breasts that have increased in firmness, weight, and size 1-3 hours after feeding.  Breasts that are softer immediately after breastfeeding.  Increased milk volume, as well as a change in milk consistency and color by the fifth day of  breastfeeding.  Nipples that are not sore, cracked, or bleeding.  Signs that your baby is getting enough milk  Wetting at least 1-2 diapers during the first 24 hours after birth.  Wetting at least 5-6 diapers every 24 hours for the first week after birth. The urine should be clear or pale yellow by the age of 5 days.  Wetting 6-8 diapers every 24 hours as your baby continues to grow and develop.  At least 3 stools in a 24-hour period by the age of 5 days. The stool should be soft and yellow.  At least 3 stools in a 24-hour period by the age of 7 days. The stool should be seedy and yellow.  No loss of weight greater than 10% of birth weight during the first 3 days of life.  Average weight gain of 4-7 oz (113-198 g) per week after the age of 4 days.  Consistent daily weight gain by the age of 5 days, without weight loss after the age of 2 weeks. After a feeding, your baby may spit up a small amount of milk. This is normal. Breastfeeding frequency and duration Frequent feeding will help you make more milk and can prevent sore nipples and extremely full breasts (breast  engorgement). Breastfeed when you feel the need to reduce the fullness of your breasts or when your baby shows signs of hunger. This is called "breastfeeding on demand." Signs that your baby is hungry include:  Increased alertness, activity, or restlessness.  Movement of the head from side to side.  Opening of the mouth when the corner of the mouth or cheek is stroked (rooting).  Increased sucking sounds, smacking lips, cooing, sighing, or squeaking.  Hand-to-mouth movements and sucking on fingers or hands.  Fussing or crying.  Avoid introducing a pacifier to your baby in the first 4-6 weeks after your baby is born. After this time, you may choose to use a pacifier. Research has shown that pacifier use during the first year of a baby's life decreases the risk of sudden infant death syndrome (SIDS). Allow your baby to  feed on each breast as long as he or she wants. When your baby unlatches or falls asleep while feeding from the first breast, offer the second breast. Because newborns are often sleepy in the first few weeks of life, you may need to awaken your baby to get him or her to feed. Breastfeeding times will vary from baby to baby. However, the following rules can serve as a guide to help you make sure that your baby is properly fed:  Newborns (babies 19 weeks of age or younger) may breastfeed every 1-3 hours.  Newborns should not go without breastfeeding for longer than 3 hours during the day or 5 hours during the night.  You should breastfeed your baby a minimum of 8 times in a 24-hour period.  Breast milk pumping Pumping and storing breast milk allows you to make sure that your baby is exclusively fed your breast milk, even at times when you are unable to breastfeed. This is especially important if you go back to work while you are still breastfeeding, or if you are not able to be present during feedings. Your lactation consultant can help you find a method of pumping that works best for you and give you guidelines about how long it is safe to store breast milk. Caring for your breasts while you breastfeed Nipples can become dry, cracked, and sore while breastfeeding. The following recommendations can help keep your breasts moisturized and healthy:  Avoid using soap on your nipples.  Wear a supportive bra designed especially for nursing. Avoid wearing underwire-style bras or extremely tight bras (sports bras).  Air-dry your nipples for 3-4 minutes after each feeding.  Use only cotton bra pads to absorb leaked breast milk. Leaking of breast milk between feedings is normal.  Use lanolin on your nipples after breastfeeding. Lanolin helps to maintain your skin's normal moisture barrier. Pure lanolin is not harmful (not toxic) to your baby. You may also hand express a few drops of breast milk and gently  massage that milk into your nipples and allow the milk to air-dry.  In the first few weeks after giving birth, some women experience breast engorgement. Engorgement can make your breasts feel heavy, warm, and tender to the touch. Engorgement peaks within 3-5 days after you give birth. The following recommendations can help to ease engorgement:  Completely empty your breasts while breastfeeding or pumping. You may want to start by applying warm, moist heat (in the shower or with warm, water-soaked hand towels) just before feeding or pumping. This increases circulation and helps the milk flow. If your baby does not completely empty your breasts while breastfeeding, pump any extra milk  after he or she is finished.  Apply ice packs to your breasts immediately after breastfeeding or pumping, unless this is too uncomfortable for you. To do this: ? Put ice in a plastic bag. ? Place a towel between your skin and the bag. ? Leave the ice on for 20 minutes, 2-3 times a day.  Make sure that your baby is latched on and positioned properly while breastfeeding.  If engorgement persists after 48 hours of following these recommendations, contact your health care provider or a Science writer. Overall health care recommendations while breastfeeding  Eat 3 healthy meals and 3 snacks every day. Well-nourished mothers who are breastfeeding need an additional 450-500 calories a day. You can meet this requirement by increasing the amount of a balanced diet that you eat.  Drink enough water to keep your urine pale yellow or clear.  Rest often, relax, and continue to take your prenatal vitamins to prevent fatigue, stress, and low vitamin and mineral levels in your body (nutrient deficiencies).  Do not use any products that contain nicotine or tobacco, such as cigarettes and e-cigarettes. Your baby may be harmed by chemicals from cigarettes that pass into breast milk and exposure to secondhand smoke. If you need  help quitting, ask your health care provider.  Avoid alcohol.  Do not use illegal drugs or marijuana.  Talk with your health care provider before taking any medicines. These include over-the-counter and prescription medicines as well as vitamins and herbal supplements. Some medicines that may be harmful to your baby can pass through breast milk.  It is possible to become pregnant while breastfeeding. If birth control is desired, ask your health care provider about options that will be safe while breastfeeding your baby. Where to find more information: [employer]: www.llli.org Contact a health care provider if:  You feel like you want to stop breastfeeding or have become frustrated with breastfeeding.  Your nipples are cracked or bleeding.  Your breasts are red, tender, or warm.  You have: ? Painful breasts or nipples. ? A swollen area on either breast. ? A fever or chills. ? Nausea or vomiting. ? Drainage other than breast milk from your nipples.  Your breasts do not become full before feedings by the fifth day after you give birth.  You feel sad and depressed.  Your baby is: ? Too sleepy to eat well. ? Having trouble sleeping. ? More than 20 week old and wetting fewer than 6 diapers in a 24-hour period. ? Not gaining weight by 95 days of age.  Your baby has fewer than 3 stools in a 24-hour period.  Your baby's skin or the white parts of his or her eyes become yellow. Get help right away if:  Your baby is overly tired (lethargic) and does not want to wake up and feed.  Your baby develops an unexplained fever. Summary  Breastfeeding offers many health benefits for infant and mothers.  Try to breastfeed your infant when he or she shows early signs of hunger.  Gently tickle or stroke your baby's lips with your finger or nipple to allow the baby to open his or her mouth. Bring the baby to your breast. Make sure that much of the areola is in your baby's  mouth. Offer one side and burp the baby before you offer the other side.  Talk with your health care provider or lactation consultant if you have questions or you face problems as you breastfeed. This information is not intended  to replace advice given to you by your health care provider. Make sure you discuss any questions you have with your health care provider. Document Released: 11/21/2005 Document Revised: 12/23/2016 Document Reviewed: 12/23/2016 Elsevier Interactive Patient Education  2018 [employer]. Pain Relief During Labor and Delivery Many things can cause pain during labor and delivery, including:  Pressure on bones and ligaments due to the baby moving through the pelvis.  Stretching of tissues due to the baby moving through the birth canal.  Muscle tension due to anxiety or nervousness.  The uterus tightening (contracting) and relaxing to help move the baby.  There are many ways to deal with the pain of labor and delivery. They include:  Taking prenatal classes. Taking these classes helps you know what to expect during your baby's birth. What you learn will increase your confidence and decrease your anxiety.  Practicing relaxation techniques or doing relaxing activities, such as: ? Focused breathing. ? Meditation. ? Visualization. ? Aroma therapy. ? Listening to your favorite music. ? Hypnosis.  Taking a warm shower or bath (hydrotherapy). This may: ? Provide comfort and relaxation. ? Lessen your perception of pain. ? Decrease the amount of pain medicine needed. ? Decrease the length of labor.  Getting a massage or counterpressure on your back.  Applying warm packs or ice packs.  Changing positions often, moving around, or using a birthing ball.  Getting: ? Pain medicine through an IV or injection into a muscle. ? Pain medicine inserted into your spinal column. ? Injections of sterile water just under the skin on your lower back (intradermal  injections). ? Laughing gas (nitrous oxide).  Discuss your pain control options with your health care provider during your prenatal visits. Explore the options offered by your hospital or birth center. What kinds of medicine are available? There are two kinds of medicines that can be used to relieve pain during labor and delivery:  Analgesics. These medicines decrease pain without causing you to lose feeling or the ability to move your muscles.  Anesthetics. These medicines block feeling in the body and can decrease your ability to move freely.  Both of these kinds of medicine can cause minor side effects, such as nausea, trouble concentrating, and sleepiness. They can also decrease the baby's heart rate before birth and affect the baby's breathing rate after birth. For this reason, health care providers are careful about when and how much medicine is given. What are specific medicines and procedures that provide pain relief? Local Anesthetics Local anesthetics are used to numb a small area of the body. They may be used along with another kind of anesthetic or used to numb the nerves of the vagina, cervix, and perineum during the second stage of labor. General Anesthetics General anesthetics cause you to lose consciousness so you do not feel pain. They are usually only used for an emergency cesarean delivery. General anesthetics are given through an IV tube and a mask. Pudendal Block A pudendal block is a form of local anesthetic. It may be used to relieve the pain associated with pushing or stretching of the perineum at the time of delivery or to further numb the perineum. A pudendal block is done by injecting numbing medicine through the vaginal wall into a nerve in the pelvis. Epidural Analgesia Epidural analgesia is given through a flexible IV catheter that is inserted into the lower back. Numbing medicine is delivered continuously to the area near your spinal column nerves (epidural space).  After having this type  of analgesia, you may be able to move your legs but you most likely will not be able to walk. Depending on the amount of medicine given, you may lose all feeling in the lower half of your body, or you may retain some level of sensation, including the urge to push. Epidural analgesia can be used to provide pain relief for a vaginal birth. Spinal Block A spinal block is similar to epidural analgesia, but the medicine is injected into the spinal fluid instead of the epidural space. A spinal block is only given once. It starts to relieve pain quickly, but the pain relief lasts only 1-6 hours. Spinal blocks can be used for cesarean deliveries. Combined Spinal-Epidural (CSE) Block A CSE block combines the effects of a spinal block and epidural analgesia. The spinal block works quickly to block all pain. The epidural analgesia provides continuous pain relief, even after the effects of the spinal block have worn off. This information is not intended to replace advice given to you by your health care provider. Make sure you discuss any questions you have with your health care provider. Document Released: 03/09/2009 Document Revised: 04/29/2016 Document Reviewed: 04/13/2016 Elsevier Interactive Patient Education  2018 [employer] WHAT OB PATIENTS CAN EXPECT   Confirmation of pregnancy and ultrasound ordered if medically indicated-[redacted] weeks gestation  New OB (NOB) intake with nurse and New OB (NOB) labs- [redacted] weeks gestation  New OB (NOB) physical examination with provider- 11/[redacted] weeks gestation  Flu vaccine-[redacted] weeks gestation  Anatomy scan-[redacted] weeks gestation  Glucose tolerance test, blood work to test for anemia, T-dap vaccine-[redacted] weeks gestation  Vaginal swabs/cultures-STD/Group B strep-[redacted] weeks gestation  Appointments every 4 weeks until 28 weeks  Every 2 weeks from 28 weeks until 36 weeks  Weekly visits from 36 weeks until delivery  Common Medications Safe in  Pregnancy  Acne:      Constipation:  Benzoyl Peroxide     Colace  Clindamycin      Dulcolax Suppository  Topica Erythromycin     Fibercon  Salicylic Acid      Metamucil         Miralax AVOID:        Senakot   Accutane    Cough:  Retin-A       Cough Drops  Tetracycline      Phenergan w/ Codeine if Rx  Minocycline      Robitussin (Plain & DM)  Antibiotics:     Crabs/Lice:  Ceclor       RID  Cephalosporins    AVOID:  E-Mycins      Kwell  Keflex  Macrobid/Macrodantin   Diarrhea:  Penicillin      Kao-Pectate  Zithromax      Imodium AD         PUSH FLUIDS AVOID:       Cipro     Fever:  Tetracycline      Tylenol (Regular or Extra  Minocycline       Strength)  Levaquin      Extra Strength-Do not          Exceed 8 tabs/24 hrs Caffeine:        '200mg'$ /day (equiv. To 1 cup of coffee or  approx. 3 12 oz sodas)         Gas: Cold/Hayfever:       Gas-X  Benadryl      Mylicon  Claritin       Phazyme  **Claritin-D  Chlor-Trimeton    Headaches:  Dimetapp      ASA-Free Excedrin  Drixoral-Non-Drowsy     Cold Compress  Mucinex (Guaifenasin)     Tylenol (Regular or Extra  Sudafed/Sudafed-12 Hour     Strength)  **Sudafed PE Pseudoephedrine   Tylenol Cold & Sinus     Vicks Vapor Rub  Zyrtec  **AVOID if Problems With Blood Pressure         Heartburn: Avoid lying down for at least 1 hour after meals  Aciphex      Maalox     Rash:  Milk of Magnesia     Benadryl    Mylanta       1% Hydrocortisone Cream  Pepcid  Pepcid Complete   Sleep Aids:  Prevacid      Ambien   Prilosec       Benadryl  Rolaids       Chamomile Tea  Tums (Limit 4/day)     Unisom  Zantac       Tylenol PM         Warm milk-add vanilla or  Hemorrhoids:       Sugar for taste  Anusol/Anusol H.C.  (RX: Analapram 2.5%)  Sugar Substitutes:  Hydrocortisone OTC     Ok in moderation  Preparation H      Tucks        Vaseline lotion applied to tissue with  wiping    Herpes:     Throat:  Acyclovir      Oragel  Famvir  Valtrex     Vaccines:         Flu Shot Leg Cramps:       *Gardasil  Benadryl      Hepatitis A         Hepatitis B Nasal Spray:       Pneumovax  Saline Nasal Spray     Polio Booster         Tetanus Nausea:       Tuberculosis test or PPD  Vitamin B6 25 mg TID   AVOID:    Dramamine      *Gardasil  Emetrol       Live Poliovirus  Ginger Root 250 mg QID    MMR (measles, mumps &  High Complex Carbs @ Bedtime    rebella)  Sea Bands-Accupressure    Varicella (Chickenpox)  Unisom 1/2 tab TID     *No known complications           If received before Pain:         Known pregnancy;   Darvocet       Resume series after  Lortab        Delivery  Percocet    Yeast:   Tramadol      Femstat  Tylenol 3      Gyne-lotrimin  Ultram       Monistat  Vicodin           MISC:         All Sunscreens           Hair Coloring/highlights          Insect Repellant's          (Including DEET)         Mystic Tans Second Trimester of Pregnancy The second trimester is from week 13 through week 28, month 4 through 6. This is often the time in pregnancy that you feel your best. Often times, morning sickness has  lessened or quit. You may have more energy, and you may get hungry more often. Your unborn baby (fetus) is growing rapidly. At the end of the sixth month, he or she is about 9 inches long and weighs about 1 pounds. You will likely feel the baby move (quickening) between 18 and 20 weeks of pregnancy. Follow these instructions at home:  Avoid all smoking, herbs, and alcohol. Avoid drugs not approved by your doctor.  Do not use any tobacco products, including cigarettes, chewing tobacco, and electronic cigarettes. If you need help quitting, ask your doctor. You may get counseling or other support to help you quit.  Only take medicine as told by your doctor. Some medicines are safe and some are not during pregnancy.  Exercise only as told by your  doctor. Stop exercising if you start having cramps.  Eat regular, healthy meals.  Wear a good support bra if your breasts are tender.  Do not use hot tubs, steam rooms, or saunas.  Wear your seat belt when driving.  Avoid raw meat, uncooked cheese, and liter boxes and soil used by cats.  Take your prenatal vitamins.  Take 1500-2000 milligrams of calcium daily starting at the 20th week of pregnancy until you deliver your baby.  Try taking medicine that helps you poop (stool softener) as needed, and if your doctor approves. Eat more fiber by eating fresh fruit, vegetables, and whole grains. Drink enough fluids to keep your pee (urine) clear or pale yellow.  Take warm water baths (sitz baths) to soothe pain or discomfort caused by hemorrhoids. Use hemorrhoid cream if your doctor approves.  If you have puffy, bulging veins (varicose veins), wear support hose. Raise (elevate) your feet for 15 minutes, 3-4 times a day. Limit salt in your diet.  Avoid heavy lifting, wear low heals, and sit up straight.  Rest with your legs raised if you have leg cramps or low back pain.  Visit your dentist if you have not gone during your pregnancy. Use a soft toothbrush to brush your teeth. Be gentle when you floss.  You can have sex (intercourse) unless your doctor tells you not to.  Go to your doctor visits. Get help if:  You feel dizzy.  You have mild cramps or pressure in your lower belly (abdomen).  You have a nagging pain in your belly area.  You continue to feel sick to your stomach (nauseous), throw up (vomit), or have watery poop (diarrhea).  You have bad smelling fluid coming from your vagina.  You have pain with peeing (urination). Get help right away if:  You have a fever.  You are leaking fluid from your vagina.  You have spotting or bleeding from your vagina.  You have severe belly cramping or pain.  You lose or gain weight rapidly.  You have trouble catching your  breath and have chest pain.  You notice sudden or extreme puffiness (swelling) of your face, hands, ankles, feet, or legs.  You have not felt the baby move in over an hour.  You have severe headaches that do not go away with medicine.  You have vision changes. This information is not intended to replace advice given to you by your health care provider. Make sure you discuss any questions you have with your health care provider. Document Released: 02/15/2010 Document Revised: 04/28/2016 Document Reviewed: 01/22/2013 Elsevier Interactive Patient Education  2017 [employer].

## 2018-11-19 ENCOUNTER — Ambulatory Visit (INDEPENDENT_AMBULATORY_CARE_PROVIDER_SITE_OTHER): Payer: Self-pay | Admitting: Certified Nurse Midwife

## 2018-11-19 VITALS — BP 106/75 | HR 105 | Wt 150.5 lb

## 2018-11-19 DIAGNOSIS — Z3492 Encounter for supervision of normal pregnancy, unspecified, second trimester: Secondary | ICD-10-CM

## 2018-11-19 DIAGNOSIS — Z131 Encounter for screening for diabetes mellitus: Secondary | ICD-10-CM

## 2018-11-19 DIAGNOSIS — Z113 Encounter for screening for infections with a predominantly sexual mode of transmission: Secondary | ICD-10-CM

## 2018-11-19 DIAGNOSIS — Z130 Encounter for screening for diseases of the blood and blood-forming organs and certain disorders involving the immune mechanism: Secondary | ICD-10-CM

## 2018-11-19 LAB — POCT URINALYSIS DIPSTICK OB
BILIRUBIN UA: NEGATIVE
Glucose, UA: NEGATIVE
Ketones, UA: NEGATIVE
Leukocytes, UA: NEGATIVE
NITRITE UA: NEGATIVE
PH UA: 6.5 (ref 5.0–8.0)
PROTEIN: NEGATIVE
RBC UA: NEGATIVE
Spec Grav, UA: 1.015 (ref 1.010–1.025)
UROBILINOGEN UA: 0.2 U/dL

## 2018-11-19 NOTE — Progress Notes (Signed)
ROB-Reports occasional diarrhea after drinking/eating milk products and intermittent non-productive cough. Discussed home treatment measures. Enrolled in classes. Desires epidural. Plans breastfeeding and POP as postpartum contraception. Peds list given, see AVS. Anticipatory guidance regarding course of prenatal care. Reviewed red flag symptoms and when to call. RTC x 3 weeks for 28 week labs and ROB or sooner if needed.

## 2018-11-19 NOTE — Patient Instructions (Addendum)
Breastfeeding Choosing to breastfeed is one of the best decisions you can make for yourself and your baby. A change in hormones during pregnancy causes your breasts to make breast milk in your milk-producing glands. Hormones prevent breast milk from being released before your baby is born. They also prompt milk flow after birth. Once breastfeeding has begun, thoughts of your baby, as well as his or her sucking or crying, can stimulate the release of milk from your milk-producing glands. Benefits of breastfeeding Research shows that breastfeeding offers many health benefits for infants and mothers. It also offers a cost-free and convenient way to feed your baby. For your baby  Your first milk (colostrum) helps your baby's digestive system to function better.  Special cells in your milk (antibodies) help your baby to fight off infections.  Breastfed babies are less likely to develop asthma, allergies, obesity, or type 2 diabetes. They are also at lower risk for sudden infant death syndrome (SIDS).  Nutrients in breast milk are better able to meet your baby's needs compared to infant formula.  Breast milk improves your baby's brain development. For you  Breastfeeding helps to create a very special bond between you and your baby.  Breastfeeding is convenient. Breast milk costs nothing and is always available at the correct temperature.  Breastfeeding helps to burn calories. It helps you to lose the weight that you gained during pregnancy.  Breastfeeding makes your uterus return faster to its size before pregnancy. It also slows bleeding (lochia) after you give birth.  Breastfeeding helps to lower your risk of developing type 2 diabetes, osteoporosis, rheumatoid arthritis, cardiovascular disease, and breast, ovarian, uterine, and endometrial cancer later in life. Breastfeeding basics Starting breastfeeding  Find a comfortable place to sit or lie down, with your neck and back  well-supported.  Place a pillow or a rolled-up blanket under your baby to bring him or her to the level of your breast (if you are seated). Nursing pillows are specially designed to help support your arms and your baby while you breastfeed.  Make sure that your baby's tummy (abdomen) is facing your abdomen.  Gently massage your breast. With your fingertips, massage from the outer edges of your breast inward toward the nipple. This encourages milk flow. If your milk flows slowly, you may need to continue this action during the feeding.  Support your breast with 4 fingers underneath and your thumb above your nipple (make the letter "C" with your hand). Make sure your fingers are well away from your nipple and your baby's mouth.  Stroke your baby's lips gently with your finger or nipple.  When your baby's mouth is open wide enough, quickly bring your baby to your breast, placing your entire nipple and as much of the areola as possible into your baby's mouth. The areola is the colored area around your nipple. ? More areola should be visible above your baby's upper lip than below the lower lip. ? Your baby's lips should be opened and extended outward (flanged) to ensure an adequate, comfortable latch. ? Your baby's tongue should be between his or her lower gum and your breast.  Make sure that your baby's mouth is correctly positioned around your nipple (latched). Your baby's lips should create a seal on your breast and be turned out (everted).  It is common for your baby to suck about 2-3 minutes in order to start the flow of breast milk. Latching Teaching your baby how to latch onto your breast properly is very  important. An improper latch can cause nipple pain, decreased milk supply, and poor weight gain in your baby. Also, if your baby is not latched onto your nipple properly, he or she may swallow some air during feeding. This can make your baby fussy. Burping your baby when you switch breasts  during the feeding can help to get rid of the air. However, teaching your baby to latch on properly is still the best way to prevent fussiness from swallowing air while breastfeeding. Signs that your baby has successfully latched onto your nipple  Silent tugging or silent sucking, without causing you pain. Infant's lips should be extended outward (flanged).  Swallowing heard between every 3-4 sucks once your milk has started to flow (after your let-down milk reflex occurs).  Muscle movement above and in front of his or her ears while sucking.  Signs that your baby has not successfully latched onto your nipple  Sucking sounds or smacking sounds from your baby while breastfeeding.  Nipple pain.  If you think your baby has not latched on correctly, slip your finger into the corner of your baby's mouth to break the suction and place it between your baby's gums. Attempt to start breastfeeding again. Signs of successful breastfeeding Signs from your baby  Your baby will gradually decrease the number of sucks or will completely stop sucking.  Your baby will fall asleep.  Your baby's body will relax.  Your baby will retain a small amount of milk in his or her mouth.  Your baby will let go of your breast by himself or herself.  Signs from you  Breasts that have increased in firmness, weight, and size 1-3 hours after feeding.  Breasts that are softer immediately after breastfeeding.  Increased milk volume, as well as a change in milk consistency and color by the fifth day of breastfeeding.  Nipples that are not sore, cracked, or bleeding.  Signs that your baby is getting enough milk  Wetting at least 1-2 diapers during the first 24 hours after birth.  Wetting at least 5-6 diapers every 24 hours for the first week after birth. The urine should be clear or pale yellow by the age of 5 days.  Wetting 6-8 diapers every 24 hours as your baby continues to grow and develop.  At least 3  stools in a 24-hour period by the age of 5 days. The stool should be soft and yellow.  At least 3 stools in a 24-hour period by the age of 7 days. The stool should be seedy and yellow.  No loss of weight greater than 10% of birth weight during the first 3 days of life.  Average weight gain of 4-7 oz (113-198 g) per week after the age of 4 days.  Consistent daily weight gain by the age of 5 days, without weight loss after the age of 2 weeks. After a feeding, your baby may spit up a small amount of milk. This is normal. Breastfeeding frequency and duration Frequent feeding will help you make more milk and can prevent sore nipples and extremely full breasts (breast engorgement). Breastfeed when you feel the need to reduce the fullness of your breasts or when your baby shows signs of hunger. This is called "breastfeeding on demand." Signs that your baby is hungry include:  Increased alertness, activity, or restlessness.  Movement of the head from side to side.  Opening of the mouth when the corner of the mouth or cheek is stroked (rooting).  Increased sucking sounds,  smacking lips, cooing, sighing, or squeaking.  Hand-to-mouth movements and sucking on fingers or hands.  Fussing or crying.  Avoid introducing a pacifier to your baby in the first 4-6 weeks after your baby is born. After this time, you may choose to use a pacifier. Research has shown that pacifier use during the first year of a baby's life decreases the risk of sudden infant death syndrome (SIDS). Allow your baby to feed on each breast as long as he or she wants. When your baby unlatches or falls asleep while feeding from the first breast, offer the second breast. Because newborns are often sleepy in the first few weeks of life, you may need to awaken your baby to get him or her to feed. Breastfeeding times will vary from baby to baby. However, the following rules can serve as a guide to help you make sure that your baby is  properly fed:  Newborns (babies 47 weeks of age or younger) may breastfeed every 1-3 hours.  Newborns should not go without breastfeeding for longer than 3 hours during the day or 5 hours during the night.  You should breastfeed your baby a minimum of 8 times in a 24-hour period.  Breast milk pumping Pumping and storing breast milk allows you to make sure that your baby is exclusively fed your breast milk, even at times when you are unable to breastfeed. This is especially important if you go back to work while you are still breastfeeding, or if you are not able to be present during feedings. Your lactation consultant can help you find a method of pumping that works best for you and give you guidelines about how long it is safe to store breast milk. Caring for your breasts while you breastfeed Nipples can become dry, cracked, and sore while breastfeeding. The following recommendations can help keep your breasts moisturized and healthy:  Avoid using soap on your nipples.  Wear a supportive bra designed especially for nursing. Avoid wearing underwire-style bras or extremely tight bras (sports bras).  Air-dry your nipples for 3-4 minutes after each feeding.  Use only cotton bra pads to absorb leaked breast milk. Leaking of breast milk between feedings is normal.  Use lanolin on your nipples after breastfeeding. Lanolin helps to maintain your skin's normal moisture barrier. Pure lanolin is not harmful (not toxic) to your baby. You may also hand express a few drops of breast milk and gently massage that milk into your nipples and allow the milk to air-dry.  In the first few weeks after giving birth, some women experience breast engorgement. Engorgement can make your breasts feel heavy, warm, and tender to the touch. Engorgement peaks within 3-5 days after you give birth. The following recommendations can help to ease engorgement:  Completely empty your breasts while breastfeeding or pumping. You  may want to start by applying warm, moist heat (in the shower or with warm, water-soaked hand towels) just before feeding or pumping. This increases circulation and helps the milk flow. If your baby does not completely empty your breasts while breastfeeding, pump any extra milk after he or she is finished.  Apply ice packs to your breasts immediately after breastfeeding or pumping, unless this is too uncomfortable for you. To do this: ? Put ice in a plastic bag. ? Place a towel between your skin and the bag. ? Leave the ice on for 20 minutes, 2-3 times a day.  Make sure that your baby is latched on and positioned properly while  breastfeeding.  If engorgement persists after 48 hours of following these recommendations, contact your health care provider or a Science writer. Overall health care recommendations while breastfeeding  Eat 3 healthy meals and 3 snacks every day. Well-nourished mothers who are breastfeeding need an additional 450-500 calories a day. You can meet this requirement by increasing the amount of a balanced diet that you eat.  Drink enough water to keep your urine pale yellow or clear.  Rest often, relax, and continue to take your prenatal vitamins to prevent fatigue, stress, and low vitamin and mineral levels in your body (nutrient deficiencies).  Do not use any products that contain nicotine or tobacco, such as cigarettes and e-cigarettes. Your baby may be harmed by chemicals from cigarettes that pass into breast milk and exposure to secondhand smoke. If you need help quitting, ask your health care provider.  Avoid alcohol.  Do not use illegal drugs or marijuana.  Talk with your health care provider before taking any medicines. These include over-the-counter and prescription medicines as well as vitamins and herbal supplements. Some medicines that may be harmful to your baby can pass through breast milk.  It is possible to become pregnant while breastfeeding. If  birth control is desired, ask your health care provider about options that will be safe while breastfeeding your baby. Where to find more information: [employer]: www.llli.org Contact a health care provider if:  You feel like you want to stop breastfeeding or have become frustrated with breastfeeding.  Your nipples are cracked or bleeding.  Your breasts are red, tender, or warm.  You have: ? Painful breasts or nipples. ? A swollen area on either breast. ? A fever or chills. ? Nausea or vomiting. ? Drainage other than breast milk from your nipples.  Your breasts do not become full before feedings by the fifth day after you give birth.  You feel sad and depressed.  Your baby is: ? Too sleepy to eat well. ? Having trouble sleeping. ? More than 36 week old and wetting fewer than 6 diapers in a 24-hour period. ? Not gaining weight by 30 days of age.  Your baby has fewer than 3 stools in a 24-hour period.  Your baby's skin or the white parts of his or her eyes become yellow. Get help right away if:  Your baby is overly tired (lethargic) and does not want to wake up and feed.  Your baby develops an unexplained fever. Summary  Breastfeeding offers many health benefits for infant and mothers.  Try to breastfeed your infant when he or she shows early signs of hunger.  Gently tickle or stroke your baby's lips with your finger or nipple to allow the baby to open his or her mouth. Bring the baby to your breast. Make sure that much of the areola is in your baby's mouth. Offer one side and burp the baby before you offer the other side.  Talk with your health care provider or lactation consultant if you have questions or you face problems as you breastfeed. This information is not intended to replace advice given to you by your health care provider. Make sure you discuss any questions you have with your health care provider. Document Released: 11/21/2005 Document  Revised: 12/23/2016 Document Reviewed: 12/23/2016 Elsevier Interactive Patient Education  2018 [employer]. Pain Relief During Labor and Delivery Many things can cause pain during labor and delivery, including:  Pressure on bones and ligaments due to the baby moving through the  pelvis.  Stretching of tissues due to the baby moving through the birth canal.  Muscle tension due to anxiety or nervousness.  The uterus tightening (contracting) and relaxing to help move the baby.  There are many ways to deal with the pain of labor and delivery. They include:  Taking prenatal classes. Taking these classes helps you know what to expect during your baby's birth. What you learn will increase your confidence and decrease your anxiety.  Practicing relaxation techniques or doing relaxing activities, such as: ? Focused breathing. ? Meditation. ? Visualization. ? Aroma therapy. ? Listening to your favorite music. ? Hypnosis.  Taking a warm shower or bath (hydrotherapy). This may: ? Provide comfort and relaxation. ? Lessen your perception of pain. ? Decrease the amount of pain medicine needed. ? Decrease the length of labor.  Getting a massage or counterpressure on your back.  Applying warm packs or ice packs.  Changing positions often, moving around, or using a birthing ball.  Getting: ? Pain medicine through an IV or injection into a muscle. ? Pain medicine inserted into your spinal column. ? Injections of sterile water just under the skin on your lower back (intradermal injections). ? Laughing gas (nitrous oxide).  Discuss your pain control options with your health care provider during your prenatal visits. Explore the options offered by your hospital or birth center. What kinds of medicine are available? There are two kinds of medicines that can be used to relieve pain during labor and delivery:  Analgesics. These medicines decrease pain without causing you to lose feeling or the  ability to move your muscles.  Anesthetics. These medicines block feeling in the body and can decrease your ability to move freely.  Both of these kinds of medicine can cause minor side effects, such as nausea, trouble concentrating, and sleepiness. They can also decrease the baby's heart rate before birth and affect the baby's breathing rate after birth. For this reason, health care providers are careful about when and how much medicine is given. What are specific medicines and procedures that provide pain relief? Local Anesthetics Local anesthetics are used to numb a small area of the body. They may be used along with another kind of anesthetic or used to numb the nerves of the vagina, cervix, and perineum during the second stage of labor. General Anesthetics General anesthetics cause you to lose consciousness so you do not feel pain. They are usually only used for an emergency cesarean delivery. General anesthetics are given through an IV tube and a mask. Pudendal Block A pudendal block is a form of local anesthetic. It may be used to relieve the pain associated with pushing or stretching of the perineum at the time of delivery or to further numb the perineum. A pudendal block is done by injecting numbing medicine through the vaginal wall into a nerve in the pelvis. Epidural Analgesia Epidural analgesia is given through a flexible IV catheter that is inserted into the lower back. Numbing medicine is delivered continuously to the area near your spinal column nerves (epidural space). After having this type of analgesia, you may be able to move your legs but you most likely will not be able to walk. Depending on the amount of medicine given, you may lose all feeling in the lower half of your body, or you may retain some level of sensation, including the urge to push. Epidural analgesia can be used to provide pain relief for a vaginal birth. Spinal Block A spinal block is  similar to epidural analgesia,  but the medicine is injected into the spinal fluid instead of the epidural space. A spinal block is only given once. It starts to relieve pain quickly, but the pain relief lasts only 1-6 hours. Spinal blocks can be used for cesarean deliveries. Combined Spinal-Epidural (CSE) Block A CSE block combines the effects of a spinal block and epidural analgesia. The spinal block works quickly to block all pain. The epidural analgesia provides continuous pain relief, even after the effects of the spinal block have worn off. This information is not intended to replace advice given to you by your health care provider. Make sure you discuss any questions you have with your health care provider. Document Released: 03/09/2009 Document Revised: 04/29/2016 Document Reviewed: 04/13/2016 Elsevier Interactive Patient Education  2018 [employer]. WHAT OB PATIENTS CAN EXPECT   Confirmation of pregnancy and ultrasound ordered if medically indicated-[redacted] weeks gestation  New OB (NOB) intake with nurse and New OB (NOB) labs- [redacted] weeks gestation  New OB (NOB) physical examination with provider- 11/[redacted] weeks gestation  Flu vaccine-[redacted] weeks gestation  Anatomy scan-[redacted] weeks gestation  Glucose tolerance test, blood work to test for anemia, T-dap vaccine-[redacted] weeks gestation  Vaginal swabs/cultures-STD/Group B strep-[redacted] weeks gestation  Appointments every 4 weeks until 28 weeks  Every 2 weeks from 28 weeks until 36 weeks  Weekly visits from 36 weeks until delivery  [facility]   [facility]  [address], [place], [place] [address]  Phone: [phone]   [facility] ([facility])  [address] [place], [place] [address]  Phone: [phone]   [facility] [place] [address], [place], [place] [address] Phone: [phone]   [facility]  [address] [place], [place] [address]  Phone: [phone] Trimester of  Pregnancy The third trimester is from week 29 through week 42, months 7 through 9. This trimester is when your unborn baby (fetus) is growing very fast. At the end of the ninth month, the unborn baby is about 20 inches in length. It weighs about 6-10 pounds. Follow these instructions at home:  Avoid all smoking, herbs, and alcohol. Avoid drugs not approved by your doctor.  Do not use any tobacco products, including cigarettes, chewing tobacco, and electronic cigarettes. If you need help quitting, ask your doctor. You may get counseling or other support to help you quit.  Only take medicine as told by your doctor. Some medicines are safe and some are not during pregnancy.  Exercise only as told by your doctor. Stop exercising if you start having cramps.  Eat regular, healthy meals.  Wear a good support bra if your breasts are tender.  Do not use hot tubs, steam rooms, or saunas.  Wear your seat belt when driving.  Avoid raw meat, uncooked cheese, and liter boxes and soil used by cats.  Take your prenatal vitamins.  Take 1500-2000 milligrams of calcium daily starting at the 20th week of pregnancy until you deliver your baby.  Try taking medicine that helps you poop (stool softener) as needed, and if your doctor approves. Eat more fiber by eating fresh fruit, vegetables, and whole grains. Drink enough fluids to keep your pee (urine) clear or pale yellow.  Take warm water baths (sitz baths) to soothe pain or discomfort caused by hemorrhoids. Use hemorrhoid cream if your doctor approves.  If you have puffy, bulging veins (varicose veins), wear support hose. Raise (elevate) your feet for 15 minutes, 3-4 times  a day. Limit salt in your diet.  Avoid heavy lifting, wear low heels, and sit up straight.  Rest with your legs raised if you have leg cramps or low back pain.  Visit your dentist if you have not gone during your pregnancy. Use a soft toothbrush to brush your teeth. Be gentle when  you floss.  You can have sex (intercourse) unless your doctor tells you not to.  Do not travel far distances unless you must. Only do so with your doctor's approval.  Take prenatal classes.  Practice driving to the hospital.  Pack your hospital bag.  Prepare the baby's room.  Go to your doctor visits. Get help if:  You are not sure if you are in labor or if your water has broken.  You are dizzy.  You have mild cramps or pressure in your lower belly (abdominal).  You have a nagging pain in your belly area.  You continue to feel sick to your stomach (nauseous), throw up (vomit), or have watery poop (diarrhea).  You have bad smelling fluid coming from your vagina.  You have pain with peeing (urination). Get help right away if:  You have a fever.  You are leaking fluid from your vagina.  You are spotting or bleeding from your vagina.  You have severe belly cramping or pain.  You lose or gain weight rapidly.  You have trouble catching your breath and have chest pain.  You notice sudden or extreme puffiness (swelling) of your face, hands, ankles, feet, or legs.  You have not felt the baby move in over an hour.  You have severe headaches that do not go away with medicine.  You have vision changes. This information is not intended to replace advice given to you by your health care provider. Make sure you discuss any questions you have with your health care provider. Document Released: 02/15/2010 Document Revised: 04/28/2016 Document Reviewed: 01/22/2013 Elsevier Interactive Patient Education  2017 Elsevier Inc. Skin Conditions During Pregnancy Pregnancy affects many parts of your body. One part is your skin. Most skin problems that develop during pregnancy are not serious and are considered a normal part of pregnancy. They go away on their own after the baby is born. Other skin problems may need treatment. What type of skin problems can develop during  pregnancy?  Stretch marks. Stretch marks are purple or pink lines on the skin. They may appear on the belly, breasts, thighs, or buttocks. Stretch marks are caused by weight gain that causes the skin to stretch. Stretch marks do not cause problems. Almost all women get them during pregnancy.  Darkening of the skin (hyperpigmentation). The darkening may occur in patches or as a line. Patches may appear on the face, nipples, or genital area. Lines often stretch from the belly button to the pubic area. Hyperpigmentation develops in almost all pregnant women. It is more severe in women with a dark complexion.  Spider angiomas. These are tiny pink or red lines that go out from a center point, like the legs of a spider. Usually, they are on the face, neck, and arms. They do not cause problems. They are most common in women with light complexions.  Palmar erythema. This is a reddening of the palms. It is most common in women with light complexions.  Swelling and redness. This can occur on the face, eyelids, fingers, or toes.  Pruritic urticarial papules and plaques of pregnancy (PUPPP). This is a rash that is itchy, red, and has  tiny blisters. The cause is unknown. It usually starts on the abdomen and may affect the arms or legs. It does not affect the face. It usually begins later in pregnancy. About a third of all pregnant women develop this condition. There are no associated problems to the fetus with this rash. Sometimes, oral steroids are used to calm down the itch. The rash clears after the baby is born.  Prurigo of pregnancy. This is a disease in which red patches and bumps appear on the arms and legs. The cause is unknown. The patches and bumps clear after the baby is born. About a third of pregnant women develop this disease.  Acne. Pimples may develop, including in women who have had clear skin for a long time.  Skin tags. These are small flaps of skin that stick out from the body. They may grow  or become darker during pregnancy. They are usually harmless.  Moles. These are flat or slightly raised growths. They are usually round and pink or brown. They may grow or become darker during pregnancy.  Intrahepatic cholestasis of pregnancy. This is a rare condition that causes itchy skin. It may run in families. It increases the risk of complications for the fetus. This condition usually resolves after delivery. It can recur with subsequent pregnancies.  Impetigo herpetiformis. This is a form of a severe skin disease called pustular psoriasis. Usually, delivery is the only method of resolving the condition.  Pruritic folliculitis of pregnancy. This is a rare condition that causes pimple-like skin growths. It develops in the middle or later stages of pregnancy. Its cause is unknown.It usually resolves 2-3 weeks after delivery.  Pemphigoid gestationis. This is a very rare autoimmune disease. It causes a severely itchy rash and blisters. The rash does not appear on the face, scalp, or inside of the mouth. It usually resolves 3 months after delivery. It may recur with subsequent pregnancies. Some pre-existing skin conditions, such as atopic dermatitis, may become worse during pregnancy. Follow these instructions at home: Different conditions may have different instructions. In general:  Follow all your health care provider's directions about medicines to treat skin problems while you are pregnant. Do not use any over-the-counter medicines (including medicated creams and lotions) until you have checked with your health care provider. Many medicines are not safe to use when you are pregnant.  Avoid time in the sun. This will help keep your skin from darkening. When you must be outside, use sunscreen and wear a hat with a wide brim to protect your face. The sunscreen should have a SPF of at least 15. This may help limit dark spots that develop when the skin is exposed to the sun.  To avoid problems  from stretched skin: ? Do not sit or stand for long periods of time. ? Exercise regularly. This helps keep your skin in good condition.  Use a gentle soap. This helps prevent acne.  Do not get too hot or too sweaty. This makes some skin rashes worse.  Wear loose clothes made of a soft fabric. This prevents skin irritation.  For itching, add oatmeal or cornstarch to your bathwater.  Use a skin moisturizer. Ask your health care provider for suggestions.  This information is not intended to replace advice given to you by your health care provider. Make sure you discuss any questions you have with your health care provider. Document Released: 12/24/2010 Document Revised: 04/28/2016 Document Reviewed: 09/02/2013 Elsevier Interactive Patient Education  [employer].

## 2018-11-19 NOTE — Progress Notes (Signed)
ROB, patient had cold 2 weeks ago and currently has a lingering cough x4 days.

## 2018-12-10 ENCOUNTER — Other Ambulatory Visit: Payer: BLUE CROSS/BLUE SHIELD

## 2018-12-10 ENCOUNTER — Ambulatory Visit (INDEPENDENT_AMBULATORY_CARE_PROVIDER_SITE_OTHER): Payer: BLUE CROSS/BLUE SHIELD | Admitting: Certified Nurse Midwife

## 2018-12-10 ENCOUNTER — Encounter: Payer: Self-pay | Admitting: Certified Nurse Midwife

## 2018-12-10 VITALS — BP 108/69 | HR 96 | Wt 153.6 lb

## 2018-12-10 DIAGNOSIS — Z131 Encounter for screening for diabetes mellitus: Secondary | ICD-10-CM

## 2018-12-10 DIAGNOSIS — Z3492 Encounter for supervision of normal pregnancy, unspecified, second trimester: Secondary | ICD-10-CM

## 2018-12-10 DIAGNOSIS — Z130 Encounter for screening for diseases of the blood and blood-forming organs and certain disorders involving the immune mechanism: Secondary | ICD-10-CM

## 2018-12-10 DIAGNOSIS — Z113 Encounter for screening for infections with a predominantly sexual mode of transmission: Secondary | ICD-10-CM

## 2018-12-10 LAB — POCT URINALYSIS DIPSTICK OB
BILIRUBIN UA: NEGATIVE
Blood, UA: NEGATIVE
Glucose, UA: NEGATIVE
Ketones, UA: NEGATIVE
Leukocytes, UA: NEGATIVE
Nitrite, UA: NEGATIVE
POC,PROTEIN,UA: NEGATIVE
Spec Grav, UA: 1.01 (ref 1.010–1.025)
Urobilinogen, UA: 0.2 E.U./dL
pH, UA: 8 (ref 5.0–8.0)

## 2018-12-10 MED ORDER — TETANUS-DIPHTH-ACELL PERTUSSIS 5-2.5-18.5 LF-MCG/0.5 IM SUSP
0.5000 mL | Freq: Once | INTRAMUSCULAR | Status: AC
  Administered 2018-12-10: 0.5 mL via INTRAMUSCULAR

## 2018-12-10 NOTE — Patient Instructions (Signed)
Glucose Tolerance Test During Pregnancy Why am I having this test? The glucose tolerance test (GTT) is done to check how your body processes sugar (glucose). This is one of several tests used to diagnose diabetes that develops during pregnancy (gestational diabetes mellitus). Gestational diabetes is a temporary form of diabetes that some women develop during pregnancy. It usually occurs during the second trimester of pregnancy and goes away after delivery. Testing (screening) for gestational diabetes usually occurs between 24 and 28 weeks of pregnancy. You may have the GTT test after having a 1-hour glucose screening test if the results from that test indicate that you may have gestational diabetes. You may also have this test if:  You have a history of gestational diabetes.  You have a history of giving birth to very large babies or have experienced repeated fetal loss (stillbirth).  You have signs and symptoms of diabetes, such as: ? Changes in your vision. ? Tingling or numbness in your hands or feet. ? Changes in hunger, thirst, and urination that are not otherwise explained by your pregnancy. What is being tested? This test measures the amount of glucose in your blood at different times during a period of 3 hours. This indicates how well your body is able to process glucose. What kind of sample is taken?  Blood samples are required for this test. They are usually collected by inserting a needle into a blood vessel. How do I prepare for this test?  For 3 days before your test, eat normally. Have plenty of carbohydrate-rich foods.  Follow instructions from your health care provider about: ? Eating or drinking restrictions on the day of the test. You may be asked to not eat or drink anything other than water (fast) starting 8-10 hours before the test. ? Changing or stopping your regular medicines. Some medicines may interfere with this test. Tell a health care provider about:  All  medicines you are taking, including vitamins, herbs, eye drops, creams, and over-the-counter medicines.  Any blood disorders you have.  Any surgeries you have had.  Any medical conditions you have. What happens during the test? First, your blood glucose will be measured. This is referred to as your fasting blood glucose, since you fasted before the test. Then, you will drink a glucose solution that contains a certain amount of glucose. Your blood glucose will be measured again 1, 2, and 3 hours after drinking the solution. This test takes about 3 hours to complete. You will need to stay at the testing location during this time. During the testing period:  Do not eat or drink anything other than the glucose solution.  Do not exercise.  Do not use any products that contain nicotine or tobacco, such as cigarettes and e-cigarettes. If you need help stopping, ask your health care provider. The testing procedure may vary among health care providers and hospitals. How are the results reported? Your results will be reported as milligrams of glucose per deciliter of blood (mg/dL) or millimoles per liter (mmol/L). Your health care provider will compare your results to normal ranges that were established after testing a large group of people (reference ranges). Reference ranges may vary among labs and hospitals. For this test, common reference ranges are:  Fasting: less than 95-105 mg/dL (5.3-5.8 mmol/L).  1 hour after drinking glucose: less than 180-190 mg/dL (10.0-10.5 mmol/L).  2 hours after drinking glucose: less than 155-165 mg/dL (8.6-9.2 mmol/L).  3 hours after drinking glucose: 140-145 mg/dL (7.8-8.1 mmol/L). What do the   results mean? Results within reference ranges are considered normal, meaning that your glucose levels are well-controlled. If two or more of your blood glucose levels are high, you may be diagnosed with gestational diabetes. If only one level is high, your health care  provider may suggest repeat testing or other tests to confirm a diagnosis. Talk with your health care provider about what your results mean. Questions to ask your health care provider Ask your health care provider, or the department that is doing the test:  When will my results be ready?  How will I get my results?  What are my treatment options?  What other tests do I need?  What are my next steps? Summary  The glucose tolerance test (GTT) is one of several tests used to diagnose diabetes that develops during pregnancy (gestational diabetes mellitus). Gestational diabetes is a temporary form of diabetes that some women develop during pregnancy.  You may have the GTT test after having a 1-hour glucose screening test if the results from that test indicate that you may have gestational diabetes. You may also have this test if you have any symptoms or risk factors for gestational diabetes.  Talk with your health care provider about what your results mean. This information is not intended to replace advice given to you by your health care provider. Make sure you discuss any questions you have with your health care provider. Document Released: 05/22/2012 Document Revised: 07/03/2017 Document Reviewed: 07/03/2017 Elsevier Interactive Patient Education  2019 [employer] Inc.  

## 2018-12-10 NOTE — Progress Notes (Signed)
ROB doing well. States she is having some constipation. Encouraged use of colace daily. 28 wks labs today BTC/CBC/RPR/glucose screen and TDap today. Pt is planning on pill for birth control. She has the doula pamphlet and sample birth plan given to pt. Will follow up with results. Return in 2 wks.   Doreene Burke, CNM

## 2018-12-11 LAB — CBC
Hematocrit: 33.5 % — ABNORMAL LOW (ref 34.0–46.6)
Hemoglobin: 11.8 g/dL (ref 11.1–15.9)
MCH: 32.9 pg (ref 26.6–33.0)
MCHC: 35.2 g/dL (ref 31.5–35.7)
MCV: 93 fL (ref 79–97)
Platelets: 228 10*3/uL (ref 150–450)
RBC: 3.59 x10E6/uL — ABNORMAL LOW (ref 3.77–5.28)
RDW: 11.1 % — AB (ref 11.7–15.4)
WBC: 9.7 10*3/uL (ref 3.4–10.8)

## 2018-12-11 LAB — RPR: RPR Ser Ql: NONREACTIVE

## 2018-12-11 LAB — GLUCOSE, 1 HOUR GESTATIONAL: Gestational Diabetes Screen: 106 mg/dL (ref 65–139)

## 2018-12-23 ENCOUNTER — Other Ambulatory Visit: Payer: Self-pay | Admitting: Certified Nurse Midwife

## 2018-12-24 ENCOUNTER — Ambulatory Visit (INDEPENDENT_AMBULATORY_CARE_PROVIDER_SITE_OTHER): Payer: BLUE CROSS/BLUE SHIELD | Admitting: Certified Nurse Midwife

## 2018-12-24 ENCOUNTER — Encounter: Payer: Self-pay | Admitting: Certified Nurse Midwife

## 2018-12-24 VITALS — BP 113/74 | HR 95 | Wt 156.1 lb

## 2018-12-24 DIAGNOSIS — Z3492 Encounter for supervision of normal pregnancy, unspecified, second trimester: Secondary | ICD-10-CM | POA: Diagnosis not present

## 2018-12-24 LAB — POCT URINALYSIS DIPSTICK OB
Bilirubin, UA: NEGATIVE
Blood, UA: NEGATIVE
GLUCOSE, UA: NEGATIVE
Ketones, UA: NEGATIVE
Leukocytes, UA: NEGATIVE
Nitrite, UA: NEGATIVE
POC,PROTEIN,UA: NEGATIVE
Spec Grav, UA: 1.025 (ref 1.010–1.025)
Urobilinogen, UA: 0.2 E.U./dL
pH, UA: 6 (ref 5.0–8.0)

## 2018-12-24 NOTE — Patient Instructions (Signed)
How a Baby Grows During Pregnancy    Pregnancy begins when a female's sperm enters a female's egg (fertilization). Fertilization usually happens in one of the tubes (fallopian tubes) that connect the ovaries to the womb (uterus). The fertilized egg moves down the fallopian tube to the uterus. Once it reaches the uterus, it implants into the lining of the uterus and begins to grow.  For the first 10 weeks, the fertilized egg is called an embryo. After 10 weeks, it is called a fetus. As the fetus continues to grow, it receives oxygen and nutrients through tissue (placenta) that grows to support the developing baby. The placenta is the life support system for the baby. It provides oxygen and nutrition and removes waste.  Learning as much as you can about your pregnancy and how your baby is developing can help you enjoy the experience. It can also make you aware of when there might be a problem and when to ask questions.  How long does a typical pregnancy last?  A pregnancy usually lasts 280 days, or about 40 weeks. Pregnancy is divided into three periods of growth, also called trimesters:   First trimester: 0-12 weeks.   Second trimester: 13-27 weeks.   Third trimester: 28-40 weeks.  The day when your baby is ready to be born (full term) is your estimated date of delivery.  How does my baby develop month by month?  First month   The fertilized egg attaches to the inside of the uterus.   Some cells will form the placenta. Others will form the fetus.   The arms, legs, brain, spinal cord, lungs, and heart begin to develop.   At the end of the first month, the heart begins to beat.  Second month   The bones, inner ear, eyelids, hands, and feet form.   The genitals develop.   By the end of 8 weeks, all major organs are developing.  Third month   All of the internal organs are forming.   Teeth develop below the gums.   Bones and muscles begin to grow. The spine can flex.   The skin is transparent.   Fingernails  and toenails begin to form.   Arms and legs continue to grow longer, and hands and feet develop.   The fetus is about 3 inches (7.6 cm) long.  Fourth month   The placenta is completely formed.   The external sex organs, neck, outer ear, eyebrows, eyelids, and fingernails are formed.   The fetus can hear, swallow, and move its arms and legs.   The kidneys begin to produce urine.   The skin is covered with a white, waxy coating (vernix) and very fine hair (lanugo).  Fifth month   The fetus moves around more and can be felt for the first time (quickening).   The fetus starts to sleep and wake up and may begin to suck its finger.   The nails grow to the end of the fingers.   The organ in the digestive system that makes bile (gallbladder) functions and helps to digest nutrients.   If your baby is a girl, eggs are present in her ovaries. If your baby is a boy, testicles start to move down into his scrotum.  Sixth month   The lungs are formed.   The eyes open. The brain continues to develop.   Your baby has fingerprints and toe prints. Your baby's hair grows thicker.   At the end of the second trimester, the   fetus is about 9 inches (22.9 cm) long.  Seventh month   The fetus kicks and stretches.   The eyes are developed enough to sense changes in light.   The hands can make a grasping motion.   The fetus responds to sound.  Eighth month   All organs and body systems are fully developed and functioning.   Bones harden, and taste buds develop. The fetus may hiccup.   Certain areas of the brain are still developing. The skull remains soft.  Ninth month   The fetus gains about  lb (0.23 kg) each week.   The lungs are fully developed.   Patterns of sleep develop.   The fetus's head typically moves into a head-down position (vertex) in the uterus to prepare for birth.   The fetus weighs 6-9 lb (2.72-4.08 kg) and is 19-20 inches (48.26-50.8 cm) long.  What can I do to have a healthy pregnancy and help  my baby develop?  General instructions   Take prenatal vitamins as directed by your health care provider. These include vitamins such as folic acid, iron, calcium, and vitamin D. They are important for healthy development.   Take medicines only as directed by your health care provider. Read labels and ask a pharmacist or your health care provider whether over-the-counter medicines, supplements, and prescription drugs are safe to take during pregnancy.   Keep all follow-up visits as directed by your health care provider. This is important. Follow-up visits include prenatal care and screening tests.  How do I know if my baby is developing well?  At each prenatal visit, your health care provider will do several different tests to check on your health and keep track of your baby's development. These include:   Fundal height and position.  ? Your health care provider will measure your growing belly from your pubic bone to the top of the uterus using a tape measure.  ? Your health care provider will also feel your belly to determine your baby's position.   Heartbeat.  ? An ultrasound in the first trimester can confirm pregnancy and show a heartbeat, depending on how far along you are.  ? Your health care provider will check your baby's heart rate at every prenatal visit.   Second trimester ultrasound.  ? This ultrasound checks your baby's development. It also may show your baby's gender.  What should I do if I have concerns about my baby's development?  Always talk with your health care provider about any concerns that you may have about your pregnancy and your baby.  Summary   A pregnancy usually lasts 280 days, or about 40 weeks. Pregnancy is divided into three periods of growth, also called trimesters.   Your health care provider will monitor your baby's growth and development throughout your pregnancy.   Follow your health care provider's recommendations about taking prenatal vitamins and medicines during  your pregnancy.   Talk with your health care provider if you have any concerns about your pregnancy or your developing baby.  This information is not intended to replace advice given to you by your health care provider. Make sure you discuss any questions you have with your health care provider.  Document Released: 05/09/2008 Document Revised: 10/04/2017 Document Reviewed: 10/04/2017  Elsevier Interactive Patient Education  2019 Elsevier Inc[employer]

## 2018-12-24 NOTE — Progress Notes (Signed)
ROB doing well. States that she is not very aware of movement during work week but really feels it more on the weekend. Reassurance given. Discussed the next few visit inculding GBS at 36 wks. Follow up 2 wks.   Doreene Burke, CNM

## 2019-01-07 ENCOUNTER — Ambulatory Visit (INDEPENDENT_AMBULATORY_CARE_PROVIDER_SITE_OTHER): Payer: BLUE CROSS/BLUE SHIELD | Admitting: Certified Nurse Midwife

## 2019-01-07 VITALS — BP 108/74 | HR 95 | Wt 159.2 lb

## 2019-01-07 DIAGNOSIS — Z3403 Encounter for supervision of normal first pregnancy, third trimester: Secondary | ICD-10-CM

## 2019-01-07 LAB — POCT URINALYSIS DIPSTICK OB
Bilirubin, UA: NEGATIVE
Blood, UA: NEGATIVE
Glucose, UA: NEGATIVE
Ketones, UA: NEGATIVE
Leukocytes, UA: NEGATIVE
Nitrite, UA: NEGATIVE
Spec Grav, UA: 1.02 (ref 1.010–1.025)
Urobilinogen, UA: 0.2 E.U./dL
pH, UA: 6.5 (ref 5.0–8.0)

## 2019-01-07 NOTE — Progress Notes (Signed)
ROB-Reports intermittent rib pain and pelvic pressure. Notes baby is breech by 3D ultrasound this weekend. Discussed home treatment measures; [facility] [employer] handout given. Anticipatory guidance regarding course of prenatal care. Reviewed red flag symptoms and when to call. RTC x 2 weeks for ROB or sooner if needed.

## 2019-01-07 NOTE — Progress Notes (Signed)
ROB- Patient c/o intermittent rib pain and pressure x2 weeks.

## 2019-01-07 NOTE — Patient Instructions (Addendum)
WE WOULD LOVE TO HEAR FROM YOU!!!!   Thank you Bethany Gutierrez for visiting [facility].  Providing our patients with the best experience possible is really important to [place], and we hope that you felt that on your recent visit. The most valuable feedback we get comes from YOU!!    If you receive a survey please take a couple of minutes to let us know how we did.Thank you for continuing to trust [place] with your care.   [facility]   Third Trimester of Pregnancy  The third trimester is from week 28 through week 40 (months 7 through 9). This trimester is when your unborn baby (fetus) is growing very fast. At the end of the ninth month, the unborn baby is about 20 inches in length. It weighs about 6-10 pounds. Follow these instructions at home: Medicines  Take over-the-counter and prescription medicines only as told by your doctor. Some medicines are safe and some medicines are not safe during pregnancy.  Take a prenatal vitamin that contains at least 600 micrograms (mcg) of folic acid.  If you have trouble pooping (constipation), take medicine that will make your stool soft (stool softener) if your doctor approves. Eating and drinking   Eat regular, healthy meals.  Avoid raw meat and uncooked cheese.  If you get low calcium from the food you eat, talk to your doctor about taking a daily calcium supplement.  Eat four or five small meals rather than three large meals a day.  Avoid foods that are high in fat and sugars, such as fried and sweet foods.  To prevent constipation: ? Eat foods that are high in fiber, like fresh fruits and vegetables, whole grains, and beans. ? Drink enough fluids to keep your pee (urine) clear or pale yellow. Activity  Exercise only as told by your doctor. Stop exercising if you start to have cramps.  Avoid heavy lifting, wear low heels, and sit up straight.  Do not exercise if it is too hot, too humid, or if you are in a place of  great height (high altitude).  You may continue to have sex unless your doctor tells you not to. Relieving pain and discomfort  Wear a good support bra if your breasts are tender.  Take frequent breaks and rest with your legs raised if you have leg cramps or low back pain.  Take warm water baths (sitz baths) to soothe pain or discomfort caused by hemorrhoids. Use hemorrhoid cream if your doctor approves.  If you develop puffy, bulging veins (varicose veins) in your legs: ? Wear support hose or compression stockings as told by your doctor. ? Raise (elevate) your feet for 15 minutes, 3-4 times a day. ? Limit salt in your food. Safety  Wear your seat belt when driving.  Make a list of emergency phone numbers, including numbers for family, friends, the hospital, and police and fire departments. Preparing for your baby's arrival To prepare for the arrival of your baby:  Take prenatal classes.  Practice driving to the hospital.  Visit the hospital and tour the maternity area.  Talk to your work about taking leave once the baby comes.  Pack your hospital bag.  Prepare the baby's room.  Go to your doctor visits.  Buy a rear-facing car seat. Learn how to install it in your car. General instructions  Do not use hot tubs, steam rooms, or saunas.  Do not use any products that contain nicotine or tobacco, such as  cigarettes and e-cigarettes. If you need help quitting, ask your doctor.  Do not drink alcohol.  Do not douche or use tampons or scented sanitary pads.  Do not cross your legs for long periods of time.  Do not travel for long distances unless you must. Only do so if your doctor says it is okay.  Visit your dentist if you have not gone during your pregnancy. Use a soft toothbrush to brush your teeth. Be gentle when you floss.  Avoid cat litter boxes and soil used by cats. These carry germs that can cause birth defects in the baby and can cause a loss of your baby  (miscarriage) or stillbirth.  Keep all your prenatal visits as told by your doctor. This is important. Contact a doctor if:  You are not sure if you are in labor or if your water has broken.  You are dizzy.  You have mild cramps or pressure in your lower belly.  You have a nagging pain in your belly area.  You continue to feel sick to your stomach, you throw up, or you have watery poop.  You have bad smelling fluid coming from your vagina.  You have pain when you pee. Get help right away if:  You have a fever.  You are leaking fluid from your vagina.  You are spotting or bleeding from your vagina.  You have severe belly cramps or pain.  You lose or gain weight quickly.  You have trouble catching your breath and have chest pain.  You notice sudden or extreme puffiness (swelling) of your face, hands, ankles, feet, or legs.  You have not felt the baby move in over an hour.  You have severe headaches that do not go away with medicine.  You have trouble seeing.  You are leaking, or you are having a gush of fluid, from your vagina before you are 37 weeks.  You have regular belly spasms (contractions) before you are 37 weeks. Summary  The third trimester is from week 28 through week 40 (months 7 through 9). This time is when your unborn baby is growing very fast.  Follow your doctor's advice about medicine, food, and activity.  Get ready for the arrival of your baby by taking prenatal classes, getting all the baby items ready, preparing the baby's room, and visiting your doctor to be checked.  Get help right away if you are bleeding from your vagina, or you have chest pain and trouble catching your breath, or if you have not felt your baby move in over an hour. This information is not intended to replace advice given to you by your health care provider. Make sure you discuss any questions you have with your health care provider. Document Released: 02/15/2010 Document  Revised: 12/27/2016 Document Reviewed: 12/27/2016 Elsevier Interactive Patient Education  2019 [employer].

## 2019-01-16 ENCOUNTER — Encounter: Payer: Self-pay | Admitting: *Deleted

## 2019-01-16 ENCOUNTER — Observation Stay
Admission: EM | Admit: 2019-01-16 | Discharge: 2019-01-16 | Disposition: A | Discharge status: Home / Self Care | Payer: BLUE CROSS/BLUE SHIELD | Attending: Obstetrics and Gynecology | Admitting: Obstetrics and Gynecology

## 2019-01-16 DIAGNOSIS — O26893 Other specified pregnancy related conditions, third trimester: Secondary | ICD-10-CM | POA: Diagnosis present

## 2019-01-16 DIAGNOSIS — Y92019 Unspecified place in single-family (private) house as the place of occurrence of the external cause: Secondary | ICD-10-CM | POA: Diagnosis not present

## 2019-01-16 DIAGNOSIS — W010XXA Fall on same level from slipping, tripping and stumbling without subsequent striking against object, initial encounter: Secondary | ICD-10-CM | POA: Insufficient documentation

## 2019-01-16 DIAGNOSIS — Z3A34 34 weeks gestation of pregnancy: Secondary | ICD-10-CM | POA: Insufficient documentation

## 2019-01-16 DIAGNOSIS — Z3490 Encounter for supervision of normal pregnancy, unspecified, unspecified trimester: Secondary | ICD-10-CM

## 2019-01-16 DIAGNOSIS — W19XXXA Unspecified fall, initial encounter: Secondary | ICD-10-CM | POA: Diagnosis not present

## 2019-01-16 DIAGNOSIS — O9A213 Injury, poisoning and certain other consequences of external causes complicating pregnancy, third trimester: Secondary | ICD-10-CM | POA: Diagnosis not present

## 2019-01-16 NOTE — Discharge Instructions (Signed)
Preterm Labor and Birth Information °Pregnancy normally lasts 39-41 weeks. Preterm labor is when labor starts early. It starts before you have been pregnant for 37 whole weeks. °What are the risk factors for preterm labor? °Preterm labor is more likely to occur in women who: °· Have an infection while pregnant. °· Have a cervix that is short. °· Have gone into preterm labor before. °· Have had surgery on their cervix. °· Are younger than age 27. °· Are older than age 35. °· Are African American. °· Are pregnant with two or more babies. °· Take street drugs while pregnant. °· Smoke while pregnant. °· Do not gain enough weight while pregnant. °· Got pregnant right after another pregnancy. °What are the symptoms of preterm labor? °Symptoms of preterm labor include: °· Cramps. The cramps may feel like the cramps some women get during their period. The cramps may happen with watery poop (diarrhea). °· Pain in the belly (abdomen). °· Pain in the lower back. °· Regular contractions or tightening. It may feel like your belly is getting tighter. °· Pressure in the lower belly that seems to get stronger. °· More fluid (discharge) leaking from the vagina. The fluid may be watery or bloody. °· Water breaking. °Why is it important to notice signs of preterm labor? °Babies who are born early may not be fully developed. They have a higher chance for: °· Long-term heart problems. °· Long-term lung problems. °· Trouble controlling body systems, like breathing. °· Bleeding in the brain. °· A condition called cerebral palsy. °· Learning difficulties. °· Death. °These risks are highest for babies who are born before 34 weeks of pregnancy. °How is preterm labor treated? °Treatment depends on: °· How long you were pregnant. °· Your condition. °· The health of your baby. °Treatment may involve: °· Having a stitch (suture) placed in your cervix. When you give birth, your cervix opens so the baby can come out. The stitch keeps the cervix  from opening too soon. °· Staying at the hospital. °· Taking or getting medicines, such as: °? Hormone medicines. °? Medicines to stop contractions. °? Medicines to help the baby’s lungs develop. °? Medicines to prevent your baby from having cerebral palsy. °What should I do if I am in preterm labor? °If you think you are going into labor too soon, call your doctor right away. °How can I prevent preterm labor? °· Do not use any tobacco products. °? Examples of these are cigarettes, chewing tobacco, and e-cigarettes. °? If you need help quitting, ask your doctor. °· Do not use street drugs. °· Do not use any medicines unless you ask your doctor if they are safe for you. °· Talk with your doctor before taking any herbal supplements. °· Make sure you gain enough weight. °· Watch for infection. If you think you might have an infection, get it checked right away. °· If you have gone into preterm labor before, tell your doctor. °This information is not intended to replace advice given to you by your health care provider. Make sure you discuss any questions you have with your health care provider. °Document Released: 02/17/2009 Document Revised: 05/03/2016 Document Reviewed: 04/13/2016 °Elsevier Interactive Patient Education © 2019 Elsevier Inc. ° °

## 2019-01-16 NOTE — OB Triage Note (Signed)
Pt. presented to triage with reported fall at 1800. She fell down 3-4 stairs on her back/left side. She reports a 6/10 left, lower back pain that increases with movement and she describes as soreness. No bleeding or LOF. Positive fetal movement. VSS. Will continue to monitor.

## 2019-01-21 ENCOUNTER — Ambulatory Visit (INDEPENDENT_AMBULATORY_CARE_PROVIDER_SITE_OTHER): Payer: BLUE CROSS/BLUE SHIELD | Admitting: Certified Nurse Midwife

## 2019-01-21 ENCOUNTER — Encounter: Payer: Self-pay | Admitting: Certified Nurse Midwife

## 2019-01-21 VITALS — BP 112/75 | HR 95 | Wt 160.1 lb

## 2019-01-21 DIAGNOSIS — Z3403 Encounter for supervision of normal first pregnancy, third trimester: Secondary | ICD-10-CM | POA: Diagnosis not present

## 2019-01-21 DIAGNOSIS — R350 Frequency of micturition: Secondary | ICD-10-CM

## 2019-01-21 LAB — POCT URINALYSIS DIPSTICK OB
Bilirubin, UA: NEGATIVE
Blood, UA: NEGATIVE
Glucose, UA: NEGATIVE
Ketones, UA: NEGATIVE
Leukocytes, UA: NEGATIVE
Nitrite, UA: NEGATIVE
POC,PROTEIN,UA: NEGATIVE
Spec Grav, UA: 1.01 (ref 1.010–1.025)
Urobilinogen, UA: 0.2 E.U./dL
pH, UA: 6.5 (ref 5.0–8.0)

## 2019-01-21 NOTE — Progress Notes (Signed)
ROB doing well. Pt state she fell last week and went to ED. She feels good movement. She has done forward leaning inversion and feels like baby has moved. She is feeling more pressure down low and feeling more kicks in her ribs. On [place] it feels vertex. Fetal heart tones auscultated in right lower quadrant. PT also complains of intermittent rash, especially when she gets hot. Encouraged benadryl, reassurance given.  Discussed GBS and cultures next visit. Will confirm presentation with [facility]. Other wise will do u/s for presentation. She verbalizes and agrees to plan. Follow up 2 wks with Melody.   Doreene Burke, CNM

## 2019-01-21 NOTE — Patient Instructions (Signed)
Group B Streptococcus Infection During Pregnancy  Group B Streptococcus (GBS) is a type of bacteria (Streptococcus agalactiae) that is often found in healthy people, commonly in the rectum, vagina, and intestines. In people who are healthy and not pregnant, the bacteria rarely cause serious illness or complications. However, women who test positive for GBS during pregnancy can pass the bacteria to their baby during childbirth, which can cause serious infection in the baby after birth. Women with GBS may also have infections during their pregnancy or immediately after childbirth, such as such as urinary tract infections (UTIs) or infections of the uterus (uterine infections). Having GBS also increases a woman's risk of complications during pregnancy, such as early (preterm) labor or delivery, miscarriage, or stillbirth. Routine testing (screening) for GBS is recommended for all pregnant women. What increases the risk? You may have a higher risk for GBS infection during pregnancy if you had one during a past pregnancy. What are the signs or symptoms? In most cases, GBS infection does not cause symptoms in pregnant women. Signs and symptoms of a possible GBS-related infection may include:  Labor starting before the 37th week of pregnancy.  A UTI or bladder infection, which may cause: ? Fever. ? Pain or burning during urination. ? Frequent urination.  Fever during labor, along with: ? Bad-smelling discharge. ? Uterine tenderness. ? Rapid heartbeat in the mother, baby, or both. Rare but serious symptoms of a possible GBS-related infection in women include:  Blood infection (septicemia). This may cause fever, chills, or confusion.  Lung infection (pneumonia). This may cause fever, chills, cough, rapid breathing, difficulty breathing, or chest pain.  Bone, joint, skin, or soft tissue infection. How is this diagnosed? You may be screened for GBS between week 35 and week 37 of your pregnancy. If  you have symptoms of preterm labor, you may be screened earlier. This condition is diagnosed based on lab test results from:  A swab of fluid from the vagina and rectum.  A urine sample. How is this treated? This condition is treated with antibiotic medicine. When you go into labor, or as soon as your water breaks (your membranes rupture), you will be given antibiotics through an IV tube. Antibiotics will continue until after you give birth. If you are having a cesarean delivery, you do not need antibiotics unless your membranes have already ruptured. Follow these instructions at home:  Take over-the-counter and prescription medicines only as told by your health care provider.  Take your antibiotic medicine as told by your health care provider. Do not stop taking the antibiotic even if you start to feel better.  Keep all pre-birth (prenatal) visits and follow-up visits as told by your health care provider. This is important. Contact a health care provider if:  You have pain or burning when you urinate.  You have to urinate frequently.  You have a fever or chills.  You develop a bad-smelling vaginal discharge. Get help right away if:  Your membranes rupture.  You go into labor.  You have severe pain in your abdomen.  You have difficulty breathing.  You have chest pain. This information is not intended to replace advice given to you by your health care provider. Make sure you discuss any questions you have with your health care provider. Document Released: 02/28/2008 Document Revised: 06/17/2016 Document Reviewed: 06/16/2016 Elsevier Interactive Patient Education  2019 [employer] of the uterus can occur throughout pregnancy, but they are not always a sign that you are  in labor. You may have practice contractions called Braxton Double contractions. These false labor contractions are sometimes confused with true labor. What are Deberah Pelton contractions? Braxton Lona contractions are tightening movements that occur in the muscles of the uterus before labor. Unlike true labor contractions, these contractions do not result in opening (dilation) and thinning of the cervix. Toward the end of pregnancy (32-34 weeks), Braxton Gola contractions can happen more often and may become stronger. These contractions are sometimes difficult to tell apart from true labor because they can be very uncomfortable. You should not feel embarrassed if you go to the hospital with false labor. Sometimes, the only way to tell if you are in true labor is for your health care provider to look for changes in the cervix. The health care provider will do a physical exam and may monitor your contractions. If you are not in true labor, the exam should show that your cervix is not dilating and your water has not broken. If there are no other health problems associated with your pregnancy, it is completely safe for you to be sent home with false labor. You may continue to have Braxton Noguera contractions until you go into true labor. How to tell the difference between true labor and false labor True labor  Contractions last 30-70 seconds.  Contractions become very regular.  Discomfort is usually felt in the top of the uterus, and it spreads to the lower abdomen and low back.  Contractions do not go away with walking.  Contractions usually become more intense and increase in frequency.  The cervix dilates and gets thinner. False labor  Contractions are usually shorter and not as strong as true labor contractions.  Contractions are usually irregular.  Contractions are often felt in the front of the lower abdomen and in the groin.  Contractions may go away when you walk around or change positions while lying down.  Contractions get weaker and are shorter-lasting as time goes on.  The cervix usually does not dilate or become thin. Follow these  instructions at home:   Take over-the-counter and prescription medicines only as told by your health care provider.  Keep up with your usual exercises and follow other instructions from your health care provider.  Eat and drink lightly if you think you are going into labor.  If Braxton Manrique contractions are making you uncomfortable: ? Change your position from lying down or resting to walking, or change from walking to resting. ? Sit and rest in a tub of warm water. ? Drink enough fluid to keep your urine pale yellow. Dehydration may cause these contractions. ? Do slow and deep breathing several times an hour.  Keep all follow-up prenatal visits as told by your health care provider. This is important. Contact a health care provider if:  You have a fever.  You have continuous pain in your abdomen. Get help right away if:  Your contractions become stronger, more regular, and closer together.  You have fluid leaking or gushing from your vagina.  You pass blood-tinged mucus (bloody show).  You have bleeding from your vagina.  You have low back pain that you never had before.  You feel your baby's head pushing down and causing pelvic pressure.  Your baby is not moving inside you as much as it used to. Summary  Contractions that occur before labor are called Braxton Zuch contractions, false labor, or practice contractions.  Braxton Larner contractions are usually shorter, weaker, farther apart, and  less regular than true labor contractions. True labor contractions usually become progressively stronger and regular, and they become more frequent.  Manage discomfort from Braxton Demeyer contractions by changing position, resting in a warm bath, drinking plenty of water, or practicing deep breathing. This information is not intended to replace advice given to you by your health care provider. Make sure you discuss any questions you have with your health care provider. Document  Released: 04/06/2017 Document Revised: 09/05/2017 Document Reviewed: 04/06/2017 Elsevier Interactive Patient Education  2019 Elsevier Inc.  

## 2019-01-22 NOTE — OB Triage Provider Note (Signed)
L&D OB Triage Note  Bethany Gutierrez is a 28 y.o. G1P0 female at [redacted]w[redacted]d, EDD Estimated Date of Delivery: 03/01/19 who presented to triage for complaints of soreness after falling down a few steps at home. States she slipped and fell down 3-4 steps on back side. Denies any decreased fetal mov't, vaginal bleeding or cramping, just sore in general. She was evaluated by the nurses with no significant findings/findings significant for uterine contractions or major/minor injuries. Vital signs stable. An NST was performed and has been reviewed by me. She was treated with tylenol, and rest.   NST INTERPRETATION: Indications: patient reassurance  Mode: External Baseline Rate (A): 140 bpm Variability: Moderate Accelerations: 15 x 15 Decelerations: None     Contraction Frequency (min): none(pt reports no cramping or contractions)  Impression: reactive   Plan: NST performed was reviewed and was found to be reactive. She was discharged home with bleeding/labor precautions.  Continue routine prenatal care. Follow up with OB/GYN as previously scheduled.     Siedah Sedor Suzan Nailer, CNM

## 2019-01-24 ENCOUNTER — Other Ambulatory Visit: Payer: Self-pay | Admitting: Certified Nurse Midwife

## 2019-01-28 ENCOUNTER — Other Ambulatory Visit (INDEPENDENT_AMBULATORY_CARE_PROVIDER_SITE_OTHER): Payer: BLUE CROSS/BLUE SHIELD

## 2019-01-28 ENCOUNTER — Other Ambulatory Visit: Payer: BLUE CROSS/BLUE SHIELD

## 2019-01-28 DIAGNOSIS — R350 Frequency of micturition: Secondary | ICD-10-CM

## 2019-01-28 DIAGNOSIS — Z3403 Encounter for supervision of normal first pregnancy, third trimester: Secondary | ICD-10-CM

## 2019-01-28 LAB — POCT URINALYSIS DIPSTICK
Bilirubin, UA: NEGATIVE
GLUCOSE UA: NEGATIVE
Ketones, UA: NEGATIVE
Leukocytes, UA: NEGATIVE
Nitrite, UA: NEGATIVE
Protein, UA: NEGATIVE
RBC UA: NEGATIVE
Spec Grav, UA: <=1.005 — AB (ref 1.010–1.025)
Urobilinogen, UA: 0.2 E.U./dL
pH, UA: 7.5 (ref 5.0–8.0)

## 2019-01-28 NOTE — Addendum Note (Signed)
Addended by: Brooke Dare on: 01/28/2019 04:31 PM   Modules accepted: Orders

## 2019-01-28 NOTE — Addendum Note (Signed)
Addended by: Brooke Dare on: 01/28/2019 04:21 PM   Modules accepted: Orders

## 2019-01-30 LAB — URINE CULTURE: Organism ID, Bacteria: NO GROWTH

## 2019-02-01 ENCOUNTER — Telehealth: Payer: Self-pay | Admitting: Certified Nurse Midwife

## 2019-02-01 ENCOUNTER — Ambulatory Visit (INDEPENDENT_AMBULATORY_CARE_PROVIDER_SITE_OTHER): Payer: BLUE CROSS/BLUE SHIELD | Admitting: Certified Nurse Midwife

## 2019-02-01 ENCOUNTER — Telehealth: Payer: Self-pay

## 2019-02-01 VITALS — BP 119/72 | HR 79 | Wt 163.0 lb

## 2019-02-01 DIAGNOSIS — Z3403 Encounter for supervision of normal first pregnancy, third trimester: Secondary | ICD-10-CM

## 2019-02-01 LAB — POCT URINALYSIS DIPSTICK OB
BILIRUBIN UA: NEGATIVE
Glucose, UA: NEGATIVE
Ketones, UA: NEGATIVE
Leukocytes, UA: NEGATIVE
Nitrite, UA: NEGATIVE
POC,PROTEIN,UA: NEGATIVE
RBC UA: NEGATIVE
Spec Grav, UA: 1.01 (ref 1.010–1.025)
Urobilinogen, UA: 0.2 E.U./dL
pH, UA: 5 (ref 5.0–8.0)

## 2019-02-01 NOTE — Progress Notes (Signed)
Pt states her legs and feet are swelling, feel numb and tingly. Denies headache, blurred vision. Feeling baby move.

## 2019-02-01 NOTE — Progress Notes (Signed)
PT presents today for problem visit. Complains of swelling and leg pain . She denies headache , visual changes , or epigastric pain. Normal pregnancy swelling. No redness on backs of calf, negative Homn's sign. Pt encourage to use compression stockings and to elevated legs. Note given for work to allow her break q 4 hrs to elevated feet 10-15 min. Encourage to elevate feet after works, She verbalizes and agrees to plan. Follow up as scheduled for 36 wk visit.   Doreene Burke, CNM

## 2019-02-01 NOTE — Telephone Encounter (Signed)
Mychart message sent to pt to please head to the office.

## 2019-02-01 NOTE — Patient Instructions (Signed)
.  Preeclampsia and Eclampsia    Preeclampsia is a serious condition that may develop during pregnancy. It is also called toxemia of pregnancy. This condition causes high blood pressure along with other symptoms, such as swelling and headaches. These symptoms may develop as the condition gets worse. Preeclampsia may occur at 20 weeks of pregnancy or later.  Diagnosing and treating preeclampsia early is very important. If not treated early, it can cause serious problems for you and your baby. One problem it can lead to is eclampsia. Eclampsia is a condition that causes muscle jerking or shaking (convulsions or seizures) and other serious problems for the mother. During pregnancy, delivering your baby may be the best treatment for preeclampsia or eclampsia. For most women, preeclampsia and eclampsia symptoms go away after giving birth.  In rare cases, a woman may develop preeclampsia after giving birth (postpartum preeclampsia). This usually occurs within 48 hours after childbirth but may occur up to 6 weeks after giving birth.  What are the causes?  The cause of preeclampsia is not known.  What increases the risk?  The following risk factors make you more likely to develop preeclampsia:   Being pregnant for the first time.   Having had preeclampsia during a past pregnancy.   Having a family history of preeclampsia.   Having high blood pressure.   Being pregnant with more than one baby.   Being 35 or older.   Being African-American.   Having kidney disease or diabetes.   Having medical conditions such as lupus or blood diseases.   Being very overweight (obese).  What are the signs or symptoms?  The earliest signs of preeclampsia are:   High blood pressure.   Increased protein in your urine. Your health care provider will check for this at every visit before you give birth (prenatal visit).  Other symptoms that may develop as the condition gets worse include:   Severe headaches.   Sudden weight  gain.   Swelling of the hands, face, legs, and feet.   Nausea and vomiting.   Vision problems, such as blurred or double vision.   Numbness in the face, arms, legs, and feet.   Urinating less than usual.   Dizziness.   Slurred speech.   Abdominal pain, especially upper abdominal pain.   Convulsions or seizures.  How is this diagnosed?  There are no screening tests for preeclampsia. Your health care provider will ask you about symptoms and check for signs of preeclampsia during your prenatal visits. You may also have tests that include:   Urine tests.   Blood tests.   Checking your blood pressure.   Monitoring your baby's heart rate.   Ultrasound.  How is this treated?  You and your health care provider will determine the treatment approach that is best for you. Treatment may include:   Having more frequent prenatal exams to check for signs of preeclampsia, if you have an increased risk for preeclampsia.   Medicine to lower your blood pressure.   Staying in the hospital, if your condition is severe. There, treatment will focus on controlling your blood pressure and the amount of fluids in your body (fluid retention).   Taking medicine (magnesium sulfate) to prevent seizures. This may be given as an injection or through an IV.   Taking a low-dose aspirin during your pregnancy.   Delivering your baby early, if your condition gets worse. You may have your labor started with medicine (induced), or you may have a cesarean   delivery.  Follow these instructions at home:  Eating and drinking     Drink enough fluid to keep your urine pale yellow.   Avoid caffeine.  Lifestyle   Do not use any products that contain nicotine or tobacco, such as cigarettes and e-cigarettes. If you need help quitting, ask your health care provider.   Do not use alcohol or drugs.   Avoid stress as much as possible. Rest and get plenty of sleep.  General instructions   Take over-the-counter and prescription medicines only as  told by your health care provider.   When lying down, lie on your left side. This keeps pressure off your major blood vessels.   When sitting or lying down, raise (elevate) your feet. Try putting some pillows underneath your lower legs.   Exercise regularly. Ask your health care provider what kinds of exercise are best for you.   Keep all follow-up and prenatal visits as told by your health care provider. This is important.  How is this prevented?  There is no known way of preventing preeclampsia or eclampsia from developing. However, to lower your risk of complications and detect problems early:   Get regular prenatal care. Your health care provider may be able to diagnose and treat the condition early.   Maintain a healthy weight. Ask your health care provider for help managing weight gain during pregnancy.   Work with your health care provider to manage any long-term (chronic) health conditions you have, such as diabetes or kidney problems.   You may have tests of your blood pressure and kidney function after giving birth.   Your health care provider may have you take low-dose aspirin during your next pregnancy.  Contact a health care provider if:   You have symptoms that your health care provider told you may require more treatment or monitoring, such as:  ? Headaches.  ? Nausea or vomiting.  ? Abdominal pain.  ? Dizziness.  ? Light-headedness.  Get help right away if:   You have severe:  ? Abdominal pain.  ? Headaches that do not get better.  ? Dizziness.  ? Vision problems.  ? Confusion.  ? Nausea or vomiting.   You have any of the following:  ? A seizure.  ? Sudden, rapid weight gain.  ? Sudden swelling in your hands, ankles, or face.  ? Trouble moving any part of your body.  ? Numbness in any part of your body.  ? Trouble speaking.  ? Abnormal bleeding.   You faint.  Summary   Preeclampsia is a serious condition that may develop during pregnancy. It is also called toxemia of pregnancy.   This  condition causes high blood pressure along with other symptoms, such as swelling and headaches.   Diagnosing and treating preeclampsia early is very important. If not treated early, it can cause serious problems for you and your baby.   Get help right away if you have symptoms that your health care provider told you to watch for.  This information is not intended to replace advice given to you by your health care provider. Make sure you discuss any questions you have with your health care provider.  Document Released: 11/18/2000 Document Revised: 11/07/2017 Document Reviewed: 06/27/2016  Elsevier Interactive Patient Education  2019 [employer] Inc[employer]

## 2019-02-01 NOTE — Telephone Encounter (Signed)
The patient called and stated that she needs to speak with a nurse as soon as possible in regards to her being 36 wks and having extreme severe swelling in her ankles and also experiencing redness/patches skin hot to touch. Please advise.

## 2019-02-04 ENCOUNTER — Ambulatory Visit (INDEPENDENT_AMBULATORY_CARE_PROVIDER_SITE_OTHER): Payer: BLUE CROSS/BLUE SHIELD | Admitting: Certified Nurse Midwife

## 2019-02-04 VITALS — BP 112/81 | HR 99 | Wt 161.9 lb

## 2019-02-04 DIAGNOSIS — Z113 Encounter for screening for infections with a predominantly sexual mode of transmission: Secondary | ICD-10-CM | POA: Diagnosis not present

## 2019-02-04 DIAGNOSIS — Z3493 Encounter for supervision of normal pregnancy, unspecified, third trimester: Secondary | ICD-10-CM

## 2019-02-04 DIAGNOSIS — Z3685 Encounter for antenatal screening for Streptococcus B: Secondary | ICD-10-CM

## 2019-02-04 LAB — POCT URINALYSIS DIPSTICK OB
Bilirubin, UA: NEGATIVE
Blood, UA: NEGATIVE
Glucose, UA: NEGATIVE
Ketones, UA: NEGATIVE
Leukocytes, UA: NEGATIVE
NITRITE UA: NEGATIVE
POC,PROTEIN,UA: NEGATIVE
Spec Grav, UA: 1.01 (ref 1.010–1.025)
Urobilinogen, UA: 0.2 E.U./dL
pH, UA: 6.5 (ref 5.0–8.0)

## 2019-02-04 NOTE — Progress Notes (Signed)
ROB- cultures obtained, pt is having some pelvic pressure 

## 2019-02-04 NOTE — Progress Notes (Signed)
ROB-Reports pelvic pressure. Uncertain of fetal position. Completely spinning babies nightly. Fetus transverse with head to maternal right on bedside ultrasound. Discussed home treatment measures. Declines version. Anticipatory guidance regarding course of prenatal care. Herbal prep handout given. Reviewed red flag symptoms and when to call. RTC x 1 week for ROB or sooner if needed.

## 2019-02-04 NOTE — Patient Instructions (Signed)

## 2019-02-06 LAB — GC/CHLAMYDIA PROBE AMP
Chlamydia trachomatis, NAA: NEGATIVE
Neisseria gonorrhoeae by PCR: NEGATIVE

## 2019-02-06 LAB — STREP GP B NAA+RFLX: Strep Gp B NAA+Rflx: NEGATIVE

## 2019-02-11 ENCOUNTER — Ambulatory Visit (INDEPENDENT_AMBULATORY_CARE_PROVIDER_SITE_OTHER): Payer: BLUE CROSS/BLUE SHIELD | Admitting: Certified Nurse Midwife

## 2019-02-11 ENCOUNTER — Encounter: Payer: Self-pay | Admitting: Certified Nurse Midwife

## 2019-02-11 VITALS — BP 119/74 | HR 87 | Wt 163.6 lb

## 2019-02-11 DIAGNOSIS — Z3493 Encounter for supervision of normal pregnancy, unspecified, third trimester: Secondary | ICD-10-CM

## 2019-02-11 LAB — POCT URINALYSIS DIPSTICK OB
Bilirubin, UA: NEGATIVE
Ketones, UA: NEGATIVE
Leukocytes, UA: NEGATIVE
Nitrite, UA: NEGATIVE
PROTEIN: NEGATIVE
RBC UA: NEGATIVE
Spec Grav, UA: 1.02 (ref 1.010–1.025)
Urobilinogen, UA: 0.2 E.U./dL
pH, UA: 7 (ref 5.0–8.0)

## 2019-02-11 NOTE — Patient Instructions (Signed)
Braxton Gerbino Contractions Contractions of the uterus can occur throughout pregnancy, but they are not always a sign that you are in labor. You may have practice contractions called Braxton Sadek contractions. These false labor contractions are sometimes confused with true labor. What are Braxton Figge contractions? Braxton Vinzant contractions are tightening movements that occur in the muscles of the uterus before labor. Unlike true labor contractions, these contractions do not result in opening (dilation) and thinning of the cervix. Toward the end of pregnancy (32-34 weeks), Braxton Leider contractions can happen more often and may become stronger. These contractions are sometimes difficult to tell apart from true labor because they can be very uncomfortable. You should not feel embarrassed if you go to the hospital with false labor. Sometimes, the only way to tell if you are in true labor is for your health care provider to look for changes in the cervix. The health care provider will do a physical exam and may monitor your contractions. If you are not in true labor, the exam should show that your cervix is not dilating and your water has not broken. If there are no other health problems associated with your pregnancy, it is completely safe for you to be sent home with false labor. You may continue to have Braxton Arduini contractions until you go into true labor. How to tell the difference between true labor and false labor True labor  Contractions last 30-70 seconds.  Contractions become very regular.  Discomfort is usually felt in the top of the uterus, and it spreads to the lower abdomen and low back.  Contractions do not go away with walking.  Contractions usually become more intense and increase in frequency.  The cervix dilates and gets thinner. False labor  Contractions are usually shorter and not as strong as true labor contractions.  Contractions are usually irregular.  Contractions  are often felt in the front of the lower abdomen and in the groin.  Contractions may go away when you walk around or change positions while lying down.  Contractions get weaker and are shorter-lasting as time goes on.  The cervix usually does not dilate or become thin. Follow these instructions at home:   Take over-the-counter and prescription medicines only as told by your health care provider.  Keep up with your usual exercises and follow other instructions from your health care provider.  Eat and drink lightly if you think you are going into labor.  If Braxton Cafiero contractions are making you uncomfortable: ? Change your position from lying down or resting to walking, or change from walking to resting. ? Sit and rest in a tub of warm water. ? Drink enough fluid to keep your urine pale yellow. Dehydration may cause these contractions. ? Do slow and deep breathing several times an hour.  Keep all follow-up prenatal visits as told by your health care provider. This is important. Contact a health care provider if:  You have a fever.  You have continuous pain in your abdomen. Get help right away if:  Your contractions become stronger, more regular, and closer together.  You have fluid leaking or gushing from your vagina.  You pass blood-tinged mucus (bloody show).  You have bleeding from your vagina.  You have low back pain that you never had before.  You feel your baby's head pushing down and causing pelvic pressure.  Your baby is not moving inside you as much as it used to. Summary  Contractions that occur before labor are   called Braxton Gover contractions, false labor, or practice contractions.  Braxton Uber contractions are usually shorter, weaker, farther apart, and less regular than true labor contractions. True labor contractions usually become progressively stronger and regular, and they become more frequent.  Manage discomfort from Braxton Sacks contractions  by changing position, resting in a warm bath, drinking plenty of water, or practicing deep breathing. This information is not intended to replace advice given to you by your health care provider. Make sure you discuss any questions you have with your health care provider. Document Released: 04/06/2017 Document Revised: 09/05/2017 Document Reviewed: 04/06/2017 Elsevier Interactive Patient Education  2019 [employer]  

## 2019-02-11 NOTE — Progress Notes (Signed)
ROB doing well.Feels good movement. Pt has been doing spinning babies but does not feel like baby has moved. She does not want to try a version. She will follow up next week with MD for pre op appointment. If baby rotates to vertex on her own before scheduled c/section then she will try for vaginal delivery other wise she would like to have scheduled c/section.

## 2019-02-13 ENCOUNTER — Other Ambulatory Visit: Payer: Self-pay

## 2019-02-13 MED ORDER — LEVOCETIRIZINE DIHYDROCHLORIDE 5 MG PO TABS: 5.0000 mg | ORAL_TABLET | Freq: Every evening | ORAL | 3 refills | Status: DC

## 2019-02-13 MED ORDER — MONTELUKAST SODIUM 10 MG PO TABS: 10.0000 mg | ORAL_TABLET | Freq: Every day | ORAL | 1 refills | Status: DC

## 2019-02-13 NOTE — Telephone Encounter (Signed)
Refills sent

## 2019-02-13 NOTE — Telephone Encounter (Signed)
Fax from pharmacy received for a refill on Singulair. Per pts appt on 02/11/19 she reports she is NOT taking it.

## 2019-02-14 ENCOUNTER — Other Ambulatory Visit: Payer: Self-pay

## 2019-02-14 ENCOUNTER — Ambulatory Visit (INDEPENDENT_AMBULATORY_CARE_PROVIDER_SITE_OTHER): Payer: BLUE CROSS/BLUE SHIELD | Admitting: Certified Nurse Midwife

## 2019-02-14 VITALS — BP 113/71 | HR 103 | Wt 163.5 lb

## 2019-02-14 DIAGNOSIS — Z3493 Encounter for supervision of normal pregnancy, unspecified, third trimester: Secondary | ICD-10-CM

## 2019-02-14 DIAGNOSIS — R102 Pelvic and perineal pain: Secondary | ICD-10-CM

## 2019-02-14 DIAGNOSIS — M545 Low back pain: Secondary | ICD-10-CM

## 2019-02-14 DIAGNOSIS — Z3A37 37 weeks gestation of pregnancy: Secondary | ICD-10-CM

## 2019-02-14 DIAGNOSIS — O26893 Other specified pregnancy related conditions, third trimester: Secondary | ICD-10-CM

## 2019-02-14 LAB — POCT URINALYSIS DIPSTICK OB
Bilirubin, UA: NEGATIVE
Blood, UA: NEGATIVE
KETONES UA: NEGATIVE
Leukocytes, UA: NEGATIVE
Nitrite, UA: NEGATIVE
POC,PROTEIN,UA: NEGATIVE
Spec Grav, UA: 1.01 (ref 1.010–1.025)
Urobilinogen, UA: 0.2 E.U./dL
pH, UA: 7 (ref 5.0–8.0)

## 2019-02-14 NOTE — Patient Instructions (Signed)

## 2019-02-18 ENCOUNTER — Telehealth: Payer: Self-pay

## 2019-02-18 NOTE — Progress Notes (Signed)
Subjective:   Bethany Gutierrez is a 28 y.o. G1P0 [redacted]w[redacted]d being seen today for problem obstetrical visit.  Patient reports intermittent low back pain and increased pelvic pressure despite home treatment measures.  Denies contractions, vaginal bleeding or leaking of fluid.  Reports good fetal movement.  Denies difficulty breathing or respiratory distress, chest pain, abdominal pain, dysuria, and leg pain.   The following portions of the patient's history were reviewed and updated as appropriate: allergies, current medications, past family history, past medical history, past social history, past surgical history and problem list.   Objective:   BP 113/71   Pulse (!) 103   Wt 163 lb 8 oz (74.2 kg)   LMP 05/25/2018 (Exact Date)   BMI 25.61 kg/m   FHT: Fetal Heart Rate (bpm): 140  Fetal Movement: Movement: Present  Presentation: Presentation: Transverse    Abdomen:  soft, gravid, appropriate for gestational age,non-tender  Vaginal:  Discharge, clear  Cervix: 1cm,Long,Floating, firm,posterior    Urinalysis    Component Value Date/Time   GLUCOSEU Moderate (2+) (A) 02/14/2019 1124   BILIRUBINUR neg 02/14/2019 1124   UROBILINOGEN 0.2 02/14/2019 1124   NITRITE neg 02/14/2019 1124   LEUKOCYTESUR Negative 02/14/2019 1124   Assessment:   Pregnancy:  G1P0 at [redacted]w[redacted]d  1. Third trimester pregnancy  - POC Urinalysis Dipstick OB  2.  Low back pain in pregnancy  3.   Pelvic pressure in pregnancy  Plan:   Preterm labor symptoms: vaginal bleeding, contractions and leaking of fluid reviewed in detail.  Fetal movement precautions reviewed.  Follow up in 1 week as previously scheduled or sooner if needed.   Gunnar Bulla, CNM [facility], [facility]

## 2019-02-18 NOTE — Telephone Encounter (Signed)
Pt called the office to report she has been having headaches, diarrhea and abdominal pain. Denies spotting or loss of fluid. Encouraged patient to increase fluids, use tylenol- can use tylenol extra strength, use an OTC antidiarrheal medicine. Also she was encouraged to start timing pains- report to ED if the pains become more frequent, more intense, last longer or become 2-3 minutes apart. Patient has an appointment tomorrow 02/19/19 with DJE to discuss cesarean section due to baby being breech. Patient expressed understanding of above instructions.

## 2019-02-19 ENCOUNTER — Encounter: Payer: Self-pay | Admitting: Obstetrics and Gynecology

## 2019-02-19 ENCOUNTER — Other Ambulatory Visit: Payer: Self-pay

## 2019-02-19 ENCOUNTER — Ambulatory Visit (INDEPENDENT_AMBULATORY_CARE_PROVIDER_SITE_OTHER): Payer: BLUE CROSS/BLUE SHIELD | Admitting: Obstetrics and Gynecology

## 2019-02-19 VITALS — BP 104/63 | HR 93 | Ht 67.0 in | Wt 166.9 lb

## 2019-02-19 DIAGNOSIS — Z3493 Encounter for supervision of normal pregnancy, unspecified, third trimester: Secondary | ICD-10-CM

## 2019-02-19 DIAGNOSIS — O328XX0 Maternal care for other malpresentation of fetus, not applicable or unspecified: Secondary | ICD-10-CM

## 2019-02-19 LAB — POCT URINALYSIS DIPSTICK OB
Bilirubin, UA: NEGATIVE
Blood, UA: NEGATIVE
Glucose, UA: NEGATIVE
Ketones, UA: NEGATIVE
Leukocytes, UA: NEGATIVE
Nitrite, UA: NEGATIVE
POC,PROTEIN,UA: NEGATIVE
Spec Grav, UA: 1.01 (ref 1.010–1.025)
Urobilinogen, UA: 0.2 E.U./dL
pH, UA: 6.5 (ref 5.0–8.0)

## 2019-02-19 NOTE — Progress Notes (Signed)
ROB: Patient complains of irregular contractions but nothing strong.  Denies leakage of fluid.  Reports active daily fetal movement.  I performed an ultrasound which revealed the baby to be breech.  Patient does not desire version and is specifically requesting cesarean delivery as soon as possible.  Cesarean discussed in detail.  All questions answered.

## 2019-02-19 NOTE — Progress Notes (Signed)
Patient comes in today to discuss C section. 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 Patient has been having contractions off and on. She has been having some nausea, headache, and dizziness.

## 2019-02-21 ENCOUNTER — Other Ambulatory Visit: Payer: Self-pay

## 2019-02-21 ENCOUNTER — Encounter
Admission: RE | Admit: 2019-02-21 | Discharge: 2019-02-21 | Disposition: A | Discharge status: Home / Self Care | Payer: BLUE CROSS/BLUE SHIELD | Source: Ambulatory Visit | Attending: Obstetrics and Gynecology | Admitting: Obstetrics and Gynecology

## 2019-02-21 DIAGNOSIS — Z01812 Encounter for preprocedural laboratory examination: Secondary | ICD-10-CM | POA: Insufficient documentation

## 2019-02-21 DIAGNOSIS — O321XX0 Maternal care for breech presentation, not applicable or unspecified: Secondary | ICD-10-CM | POA: Diagnosis not present

## 2019-02-21 DIAGNOSIS — K219 Gastro-esophageal reflux disease without esophagitis: Secondary | ICD-10-CM | POA: Diagnosis not present

## 2019-02-21 DIAGNOSIS — Z3A39 39 weeks gestation of pregnancy: Secondary | ICD-10-CM | POA: Diagnosis not present

## 2019-02-21 DIAGNOSIS — O9962 Diseases of the digestive system complicating childbirth: Secondary | ICD-10-CM | POA: Diagnosis not present

## 2019-02-21 HISTORY — DX: Family history of other specified conditions: Z84.89

## 2019-02-21 HISTORY — DX: Other allergic rhinitis: J30.89

## 2019-02-21 HISTORY — DX: Gastro-esophageal reflux disease without esophagitis: K21.9

## 2019-02-21 LAB — CBC
HCT: 36.6 % (ref 36.0–46.0)
Hemoglobin: 12.1 g/dL (ref 12.0–15.0)
MCH: 32.2 pg (ref 26.0–34.0)
MCHC: 33.1 g/dL (ref 30.0–36.0)
MCV: 97.3 fL (ref 80.0–100.0)
NRBC: 0 % (ref 0.0–0.2)
Platelets: 250 10*3/uL (ref 150–400)
RBC: 3.76 MIL/uL — ABNORMAL LOW (ref 3.87–5.11)
RDW: 12 % (ref 11.5–15.5)
WBC: 11.5 10*3/uL — ABNORMAL HIGH (ref 4.0–10.5)

## 2019-02-21 LAB — TYPE AND SCREEN
ABO/RH(D): O POS
Antibody Screen: NEGATIVE
Extend sample reason: UNDETERMINED

## 2019-02-21 MED ORDER — CEFAZOLIN SODIUM-DEXTROSE 2-4 GM/100ML-% IV SOLN
2.0000 g | INTRAVENOUS | Status: AC
  Administered 2019-02-22: 2 g via INTRAVENOUS
  Filled 2019-02-21 (×2): qty 100

## 2019-02-21 NOTE — Patient Instructions (Signed)
Your procedure is scheduled on: 02/22/2019 Fri at 7:20am Report to Registration desk in [facility] [place]  Remember: Instructions that are not followed completely may result in serious medical risk, up to and including death, or upon the discretion of your surgeon and anesthesiologist your surgery may need to be rescheduled.    _x___ 1. Do not eat food after midnight the night before your procedure. You may drink clear liquids up to 2 hours before you are scheduled to arrive at the hospital for your procedure.  Do not drink clear liquids within 2 hours of your scheduled arrival to the hospital.  Clear liquids include  --Water or Apple juice without pulp  --Clear carbohydrate beverage such as ClearFast or Gatorade  --Black Coffee or Clear Tea (No milk, no creamers, do not add anything to                  the coffee or Tea Type 1 and type 2 diabetics should only drink water.   ____Ensure clear carbohydrate drink on the way to the hospital for bariatric patients  ____Ensure clear carbohydrate drink 3 hours before surgery for Dr Rutherford Nail patients if physician instructed.   No gum chewing or hard candies.     __x__ 2. No Alcohol for 24 hours before or after surgery.   __x__3. No Smoking or e-cigarettes for 24 prior to surgery.  Do not use any chewable tobacco products for at least 6 hour prior to surgery   ____  4. Bring all medications with you on the day of surgery if instructed.    __x__ 5. Notify your doctor if there is any change in your medical condition     (cold, fever, infections).    x___6. On the morning of surgery brush your teeth with toothpaste and water.  You may rinse your mouth with mouth wash if you wish.  Do not swallow any toothpaste or mouthwash.   Do not wear jewelry, make-up, hairpins, clips or nail polish.  Do not wear lotions, powders, or perfumes. You may wear deodorant.  Do not shave 48 hours prior to surgery. Men may shave face and neck.  Do not bring valuables to  the hospital.    [facility] is not responsible for any belongings or valuables.               Contacts, dentures or bridgework may not be worn into surgery.  Leave your suitcase in the car. After surgery it may be brought to your room.  For patients admitted to the hospital, discharge time is determined by your                       treatment team.  _  Patients discharged the day of surgery will not be allowed to drive home.  You will need someone to drive you home and stay with you the night of your procedure.    Please read over the following fact sheets that you were given:   [facility] Preparing for Surgery and or MRSA Information   _x___ Take anti-hypertensive listed below, cardiac, seizure, asthma,     anti-reflux and psychiatric medicines. These include:  1. fluticasone (FLONASE) 50 MCG/ACT nasal spray  2.omeprazole (PRILOSEC) 10 MG capsule  3.  4.  5.  6.  ____Fleets enema or Magnesium Citrate as directed.   _x___ Use CHG Soap or sage wipes as directed on instruction sheet   ____ Use inhalers on the day of surgery  and bring to hospital day of surgery  ____ Stop Metformin and Janumet 2 days prior to surgery.    ____ Take 1/2 of usual insulin dose the night before surgery and none on the morning     surgery.   _x___ Follow recommendations from Cardiologist, Pulmonologist or PCP regarding          stopping Aspirin, Coumadin, Plavix ,Eliquis, Effient, or Pradaxa, and Pletal.  X____Stop Anti-inflammatories such as Advil, Aleve, Ibuprofen, Motrin, Naproxen, Naprosyn, Goodies powders or aspirin products. OK to take Tylenol and                          Celebrex.   _x___ Stop supplements until after surgery.  But may continue Vitamin D, Vitamin B,       and multivitamin.   ____ Bring C-Pap to the hospital.

## 2019-02-22 ENCOUNTER — Inpatient Hospital Stay
Admission: RE | Admit: 2019-02-22 | Discharge: 2019-02-24 | DRG: 788 | Disposition: A | Discharge status: Home / Self Care | Payer: BLUE CROSS/BLUE SHIELD | Attending: Obstetrics and Gynecology | Admitting: Obstetrics and Gynecology

## 2019-02-22 ENCOUNTER — Inpatient Hospital Stay: Payer: BLUE CROSS/BLUE SHIELD | Admitting: Anesthesiology

## 2019-02-22 ENCOUNTER — Other Ambulatory Visit: Payer: Self-pay

## 2019-02-22 ENCOUNTER — Encounter
Admission: RE | Disposition: A | Discharge status: Home / Self Care | Payer: Self-pay | Source: Home / Self Care | Attending: Obstetrics and Gynecology

## 2019-02-22 DIAGNOSIS — O9962 Diseases of the digestive system complicating childbirth: Secondary | ICD-10-CM | POA: Diagnosis present

## 2019-02-22 DIAGNOSIS — O321XX0 Maternal care for breech presentation, not applicable or unspecified: Secondary | ICD-10-CM | POA: Diagnosis not present

## 2019-02-22 DIAGNOSIS — Z3A39 39 weeks gestation of pregnancy: Secondary | ICD-10-CM

## 2019-02-22 DIAGNOSIS — K219 Gastro-esophageal reflux disease without esophagitis: Secondary | ICD-10-CM | POA: Diagnosis present

## 2019-02-22 DIAGNOSIS — O328XX0 Maternal care for other malpresentation of fetus, not applicable or unspecified: Secondary | ICD-10-CM | POA: Diagnosis present

## 2019-02-22 LAB — ABO/RH: ABO/RH(D): O POS

## 2019-02-22 SURGERY — Surgical Case
Anesthesia: Spinal

## 2019-02-22 MED ORDER — DIPHENHYDRAMINE HCL 25 MG PO CAPS: 25.0000 mg | ORAL_CAPSULE | ORAL | Status: DC | PRN

## 2019-02-22 MED ORDER — NALBUPHINE HCL 10 MG/ML IJ SOLN: 5.0000 mg | INTRAMUSCULAR | Status: DC | PRN

## 2019-02-22 MED ORDER — LACTATED RINGERS IV SOLN
INTRAVENOUS | Status: DC
  Administered 2019-02-22: 08:00:00 via INTRAVENOUS

## 2019-02-22 MED ORDER — DIPHENHYDRAMINE HCL 25 MG PO CAPS: 25.0000 mg | ORAL_CAPSULE | Freq: Four times a day (QID) | ORAL | Status: DC | PRN

## 2019-02-22 MED ORDER — LACTATED RINGERS IV SOLN
INTRAVENOUS | Status: DC
  Administered 2019-02-23: 05:00:00 via INTRAVENOUS

## 2019-02-22 MED ORDER — OXYTOCIN 40 UNITS IN NORMAL SALINE INFUSION - SIMPLE MED
INTRAVENOUS | Status: DC | PRN
  Administered 2019-02-22: 500 mL via INTRAVENOUS

## 2019-02-22 MED ORDER — OXYCODONE HCL 5 MG PO TABS
5.0000 mg | ORAL_TABLET | Freq: Once | ORAL | Status: AC | PRN
  Administered 2019-02-22: 5 mg via ORAL

## 2019-02-22 MED ORDER — LIDOCAINE 5 % EX PTCH
1.0000 | MEDICATED_PATCH | CUTANEOUS | Status: DC
  Administered 2019-02-23: 1 via TRANSDERMAL
  Filled 2019-02-22 (×2): qty 1

## 2019-02-22 MED ORDER — NALBUPHINE HCL 10 MG/ML IJ SOLN: 5.0000 mg | Freq: Once | INTRAMUSCULAR | Status: DC | PRN

## 2019-02-22 MED ORDER — ZOLPIDEM TARTRATE 5 MG PO TABS: 5.0000 mg | ORAL_TABLET | Freq: Every evening | ORAL | Status: DC | PRN

## 2019-02-22 MED ORDER — OXYTOCIN 40 UNITS IN NORMAL SALINE INFUSION - SIMPLE MED
2.5000 [IU]/h | INTRAVENOUS | Status: AC
  Administered 2019-02-22: 2.5 [IU]/h via INTRAVENOUS

## 2019-02-22 MED ORDER — SENNOSIDES-DOCUSATE SODIUM 8.6-50 MG PO TABS
2.0000 | ORAL_TABLET | ORAL | Status: DC
  Administered 2019-02-23: 2 via ORAL
  Filled 2019-02-22: qty 2

## 2019-02-22 MED ORDER — KETOROLAC TROMETHAMINE 30 MG/ML IJ SOLN
30.0000 mg | Freq: Four times a day (QID) | INTRAMUSCULAR | Status: AC
  Administered 2019-02-22 – 2019-02-23 (×4): 30 mg via INTRAVENOUS
  Filled 2019-02-22 (×4): qty 1

## 2019-02-22 MED ORDER — PRENATAL MULTIVITAMIN CH
1.0000 | ORAL_TABLET | Freq: Every day | ORAL | Status: DC
  Administered 2019-02-22 – 2019-02-23 (×2): 1 via ORAL
  Filled 2019-02-22 (×2): qty 1

## 2019-02-22 MED ORDER — ACETAMINOPHEN 325 MG PO TABS
650.0000 mg | ORAL_TABLET | Freq: Four times a day (QID) | ORAL | Status: AC
  Administered 2019-02-22 – 2019-02-23 (×3): 650 mg via ORAL
  Filled 2019-02-22 (×3): qty 2

## 2019-02-22 MED ORDER — LACTATED RINGERS IV SOLN
INTRAVENOUS | Status: DC
  Administered 2019-02-22: 09:00:00 via INTRAVENOUS

## 2019-02-22 MED ORDER — ONDANSETRON HCL 4 MG/2ML IJ SOLN
INTRAMUSCULAR | Status: DC | PRN
  Administered 2019-02-22: 4 mg via INTRAVENOUS

## 2019-02-22 MED ORDER — ONDANSETRON HCL 4 MG/2ML IJ SOLN
INTRAMUSCULAR | Status: AC
  Filled 2019-02-22: qty 2

## 2019-02-22 MED ORDER — SODIUM CHLORIDE 0.9% FLUSH: 3.0000 mL | INTRAVENOUS | Status: DC | PRN

## 2019-02-22 MED ORDER — LIDOCAINE 5 % EX PTCH
MEDICATED_PATCH | CUTANEOUS | Status: DC | PRN
  Administered 2019-02-22: 1 via TRANSDERMAL

## 2019-02-22 MED ORDER — NALOXONE HCL 0.4 MG/ML IJ SOLN: 0.4000 mg | INTRAMUSCULAR | Status: DC | PRN

## 2019-02-22 MED ORDER — DIPHENHYDRAMINE HCL 50 MG/ML IJ SOLN: 12.5000 mg | INTRAMUSCULAR | Status: DC | PRN

## 2019-02-22 MED ORDER — ONDANSETRON HCL 4 MG/2ML IJ SOLN: 4.0000 mg | Freq: Three times a day (TID) | INTRAMUSCULAR | Status: DC | PRN

## 2019-02-22 MED ORDER — BUPIVACAINE IN DEXTROSE 0.75-8.25 % IT SOLN
INTRATHECAL | Status: DC | PRN
  Administered 2019-02-22: 1.6 mL via INTRATHECAL

## 2019-02-22 MED ORDER — FENTANYL CITRATE (PF) 100 MCG/2ML IJ SOLN
INTRAMUSCULAR | Status: DC | PRN
  Administered 2019-02-22: 15 ug via INTRAVENOUS

## 2019-02-22 MED ORDER — FENTANYL CITRATE (PF) 100 MCG/2ML IJ SOLN
INTRAMUSCULAR | Status: AC
  Filled 2019-02-22: qty 2

## 2019-02-22 MED ORDER — OXYCODONE-ACETAMINOPHEN 5-325 MG PO TABS
1.0000 | ORAL_TABLET | ORAL | Status: DC | PRN
  Administered 2019-02-23 – 2019-02-24 (×3): 1 via ORAL
  Filled 2019-02-22 (×3): qty 1

## 2019-02-22 MED ORDER — MEPERIDINE HCL 25 MG/ML IJ SOLN: 6.2500 mg | INTRAMUSCULAR | Status: DC | PRN

## 2019-02-22 MED ORDER — SIMETHICONE 80 MG PO CHEW
80.0000 mg | CHEWABLE_TABLET | Freq: Four times a day (QID) | ORAL | Status: DC
  Administered 2019-02-22 – 2019-02-24 (×7): 80 mg via ORAL
  Filled 2019-02-22 (×7): qty 1

## 2019-02-22 MED ORDER — MORPHINE SULFATE (PF) 0.5 MG/ML IJ SOLN
INTRAMUSCULAR | Status: AC
  Filled 2019-02-22: qty 10

## 2019-02-22 MED ORDER — FENTANYL CITRATE (PF) 100 MCG/2ML IJ SOLN: 25.0000 ug | INTRAMUSCULAR | Status: DC | PRN

## 2019-02-22 MED ORDER — OXYCODONE HCL 5 MG/5ML PO SOLN
5.0000 mg | Freq: Once | ORAL | Status: AC | PRN
  Filled 2019-02-22: qty 5

## 2019-02-22 MED ORDER — KETOROLAC TROMETHAMINE 30 MG/ML IJ SOLN: 30.0000 mg | Freq: Four times a day (QID) | INTRAMUSCULAR | Status: AC

## 2019-02-22 MED ORDER — MENTHOL 3 MG MT LOZG: 1.0000 | LOZENGE | OROMUCOSAL | Status: DC | PRN

## 2019-02-22 MED ORDER — PHENYLEPHRINE HCL 10 MG/ML IJ SOLN
INTRAMUSCULAR | Status: DC | PRN
  Administered 2019-02-22 (×2): 100 ug via INTRAVENOUS

## 2019-02-22 MED ORDER — SOD CITRATE-CITRIC ACID 500-334 MG/5ML PO SOLN
30.0000 mL | Freq: Once | ORAL | Status: AC
  Administered 2019-02-22: 30 mL via ORAL
  Filled 2019-02-22: qty 30

## 2019-02-22 MED ORDER — MORPHINE SULFATE-NACL 0.5-0.9 MG/ML-% IV SOSY
PREFILLED_SYRINGE | INTRAVENOUS | Status: DC | PRN
  Administered 2019-02-22: .1 mg via EPIDURAL

## 2019-02-22 MED ORDER — PHENYLEPHRINE HCL 10 MG/ML IJ SOLN
INTRAMUSCULAR | Status: AC
  Filled 2019-02-22: qty 1

## 2019-02-22 MED ORDER — OXYCODONE HCL 5 MG PO TABS
5.0000 mg | ORAL_TABLET | Freq: Four times a day (QID) | ORAL | Status: AC | PRN
  Filled 2019-02-22: qty 1

## 2019-02-22 MED ORDER — OXYTOCIN 10 UNIT/ML IJ SOLN
INTRAMUSCULAR | Status: DC | PRN
  Administered 2019-02-22: 40 [IU] via INTRAMUSCULAR

## 2019-02-22 SURGICAL SUPPLY — 25 items
ADHESIVE MASTISOL STRL (MISCELLANEOUS) ×3 IMPLANT
BAG COUNTER SPONGE EZ (MISCELLANEOUS) ×2 IMPLANT
CANISTER SUCT 3000ML PPV (MISCELLANEOUS) ×3 IMPLANT
CHLORAPREP W/TINT 26 (MISCELLANEOUS) ×6 IMPLANT
COUNTER SPONGE BAG EZ (MISCELLANEOUS) ×1
COVER WAND RF STERILE (DRAPES) ×3 IMPLANT
DRSG TELFA 3X8 NADH (GAUZE/BANDAGES/DRESSINGS) ×3 IMPLANT
GAUZE SPONGE 4X4 12PLY STRL (GAUZE/BANDAGES/DRESSINGS) ×3 IMPLANT
GLOVE BIOGEL PI ORTHO PRO 7.5 (GLOVE) ×2
GLOVE PI ORTHO PRO STRL 7.5 (GLOVE) ×1 IMPLANT
GOWN STRL REUS W/ TWL LRG LVL3 (GOWN DISPOSABLE) ×2 IMPLANT
GOWN STRL REUS W/TWL LRG LVL3 (GOWN DISPOSABLE) ×4
KIT TURNOVER KIT A (KITS) ×3 IMPLANT
NS IRRIG 1000ML POUR BTL (IV SOLUTION) ×3 IMPLANT
PACK C SECTION AR (MISCELLANEOUS) ×3 IMPLANT
PAD OB MATERNITY 4.3X12.25 (PERSONAL CARE ITEMS) ×3 IMPLANT
PAD PREP 24X41 OB/GYN DISP (PERSONAL CARE ITEMS) ×3 IMPLANT
RETRACTOR WND ALEXIS-O 25 LRG (MISCELLANEOUS) ×1 IMPLANT
RTRCTR C-SECT PINK 25CM LRG (MISCELLANEOUS) ×3 IMPLANT
RTRCTR WOUND ALEXIS O 25CM LRG (MISCELLANEOUS) ×3
SPONGE LAP 18X18 RF (DISPOSABLE) ×3 IMPLANT
SUT VIC AB 0 CTX 36 (SUTURE) ×4
SUT VIC AB 0 CTX36XBRD ANBCTRL (SUTURE) ×2 IMPLANT
SUT VIC AB 1 CT1 36 (SUTURE) ×6 IMPLANT
SUT VICRYL+ 3-0 36IN CT-1 (SUTURE) ×6 IMPLANT

## 2019-02-22 NOTE — Lactation Note (Signed)
This note was copied from a baby's chart. Lactation Consultation Note  Patient Name: Bethany Gutierrez Date: 02/22/2019 Reason for consult: Initial assessment   Maternal Data Has patient been taught Hand Expression?: Yes Does the patient have breastfeeding experience prior to this delivery?: No  Feeding Feeding Type: Breast Fed  LATCH Score Latch: Grasps breast easily, tongue down, lips flanged, rhythmical sucking.  Audible Swallowing: None  Type of Nipple: Flat  Comfort (Breast/Nipple): Soft / non-tender  Hold (Positioning): Assistance needed to correctly position infant at breast and maintain latch.  LATCH Score: 6  Interventions Interventions: Assisted with latch;Hand express;Adjust position;Support pillows;Breast feeding basics reviewed  Lactation Tools Discussed/Used Tools: Nipple Shields Nipple shield size: 20   Consult Status Consult Status: Follow-up Date: 02/22/19 Follow-up type: In-patient  LC assisted with latch and positioning of infant. Mother's breasts and areola is tight and flattening out her nipples. Infant was initially having trouble maintaining the latch but a nipple shield was used  And infant was able to stay on the breast. Parents were taught hand expression and infant stomach size in relation to colostrum produced.   Arlyss Gandy 02/22/2019, 12:02 PM

## 2019-02-22 NOTE — Transfer of Care (Signed)
Immediate Anesthesia Transfer of Care Note  Patient: Bethany Gutierrez  Procedure(s) Performed: CESAREAN SECTION (N/A )  Patient Location: PACU  Anesthesia Type:Spinal  Level of Consciousness: awake, alert  and oriented  Airway & Oxygen Therapy: Patient Spontanous Breathing and Patient connected to nasal cannula oxygen  Post-op Assessment: Report given to RN and Post -op Vital signs reviewed and stable  Post vital signs: Reviewed and stable  Last Vitals:  Vitals Value Taken Time  BP 105/68   Temp    Pulse 74   Resp 17   SpO2 99     Last Pain:  Vitals:   02/22/19 0913  TempSrc: Oral  PainSc:          Complications: No apparent anesthesia complications

## 2019-02-22 NOTE — Anesthesia Preprocedure Evaluation (Addendum)
Anesthesia Evaluation  Patient identified by MRN, date of birth, ID band Patient awake    Reviewed: Allergy & Precautions, H&P , NPO status , Patient's Chart, lab work & pertinent test results  History of Anesthesia Complications (+) Family history of anesthesia reaction and history of anesthetic complications  Airway Mallampati: II  TM Distance: >3 FB Neck ROM: full    Dental  (+) Chipped   Pulmonary asthma ,           Cardiovascular Exercise Tolerance: Good (-) hypertension(-) angina(-) DOE negative cardio ROS       Neuro/Psych    GI/Hepatic GERD  Medicated and Controlled,  Endo/Other    Renal/GU   negative genitourinary   Musculoskeletal   Abdominal   Peds  Hematology negative hematology ROS (+)   Anesthesia Other Findings Patient reports no problems with oxycodone, fentanyl or morphine in the past.  Past Medical History: No date: Asthma No date: Environmental and seasonal allergies No date: Family history of adverse reaction to anesthesia     Comment:  mother and sister post op nausea and vomiting No date: GERD (gastroesophageal reflux disease) No date: Seasonal allergies  Past Surgical History: No date: TONSILLECTOMY  BMI    Body Mass Index:  26.00 kg/m      Reproductive/Obstetrics (+) Pregnancy                            Anesthesia Physical Anesthesia Plan  ASA: III  Anesthesia Plan: Spinal   Post-op Pain Management:    Induction:   PONV Risk Score and Plan:   Airway Management Planned: Natural Airway and Nasal Cannula  Additional Equipment:   Intra-op Plan:   Post-operative Plan:   Informed Consent: I have reviewed the patients History and Physical, chart, labs and discussed the procedure including the risks, benefits and alternatives for the proposed anesthesia with the patient or authorized representative who has indicated his/her understanding and  acceptance.     Dental Advisory Given  Plan Discussed with: Anesthesiologist, CRNA and Surgeon  Anesthesia Plan Comments: (Patient reports no bleeding problems and no anticoagulant use.  Plan for spinal with backup GA  Patient consented for risks of anesthesia including but not limited to:  - adverse reactions to medications - risk of bleeding, infection, nerve damage and headache - risk of failed spinal - damage to teeth, lips or other oral mucosa - sore throat or hoarseness - Damage to heart, brain, lungs or loss of life  Patient voiced understanding.)        Anesthesia Quick Evaluation

## 2019-02-22 NOTE — Anesthesia Procedure Notes (Deleted)
Spinal

## 2019-02-22 NOTE — Interval H&P Note (Signed)
History and Physical Interval Note:  02/22/2019 9:20 AM  Bethany Gutierrez  has presented today for surgery, with the diagnosis of BREECH.  The various methods of treatment have been discussed with the patient and family. After consideration of risks, benefits and other options for treatment, the patient has consented to  Procedure(s): CESAREAN SECTION (N/A) as a surgical intervention.  The patient's history has been reviewed, patient examined, no change in status, stable for surgery.  I have reviewed the patient's chart and labs.  Questions were answered to the patient's satisfaction.     Brennan Bailey

## 2019-02-22 NOTE — Plan of Care (Signed)
Postpartum c-section delivery. Labor/ delivery education completed.  Transfer to postpartum unit

## 2019-02-22 NOTE — Anesthesia Procedure Notes (Signed)
Date/Time: 02/22/2019 9:30 AM Performed by: Henrietta Hoover, CRNA Pre-anesthesia Checklist: Patient identified, Emergency Drugs available, Suction available, Patient being monitored and Timeout performed Oxygen Delivery Method: Nasal cannula Placement Confirmation: positive ETCO2

## 2019-02-22 NOTE — Progress Notes (Signed)
Foley dC

## 2019-02-22 NOTE — Anesthesia Procedure Notes (Addendum)
Spinal  Patient location during procedure: OR Start time: 02/22/2019 9:30 AM End time: 02/22/2019 9:38 AM Staffing Anesthesiologist: Piscitello, Joseph K, MD Resident/CRNA: Pope, Kimberly, CRNA Performed: resident/CRNA  Preanesthetic Checklist Completed: patient identified, site marked, surgical consent, pre-op evaluation, timeout performed, IV checked, risks and benefits discussed and monitors and equipment checked Spinal Block Patient position: sitting Prep: ChloraPrep Patient monitoring: heart rate, cardiac monitor, continuous pulse ox and blood pressure Approach: midline Location: L3-4 Injection technique: single-shot Needle Needle type: Quincke  Needle gauge: 22 G Needle length: 9 cm Needle insertion depth: 8 cm Assessment Sensory level: T6 Additional Notes Negative paresthesia. Negative blood return. Positive free-flowing CSF. Expiration date of kit checked and confirmed. Patient tolerated procedure well, without complications.       

## 2019-02-22 NOTE — Anesthesia Post-op Follow-up Note (Signed)
Anesthesia QCDR form completed.        

## 2019-02-22 NOTE — Op Note (Signed)
° ° °    OP NOTE  Date: 02/22/2019   11:35 AM Name Bethany Gutierrez MR# [id number]  Preoperative Diagnosis: 1. Intrauterine pregnancy at [redacted]w[redacted]d Active Problems:   Abnormal fetal lie, antepartum  2.  malpresentation: Breech  Postoperative Diagnosis: 1. Intrauterine pregnancy at [redacted]w[redacted]d, delivered 2. Viable infant 3. Remainder same as pre-op   Procedure: 1. Primary Low-Transverse Cesarean Section  Surgeon: Elonda Husky, MD  Assistant:  Janee Morn CNM  Anesthesia: Spinal   EBL: 750  ml    Findings: 1) female infant, Apgar scores of 8    at 1 minute and 9    at 5 minutes and a birthweight of 106.17  ounces.    2) Normal uterus, tubes and ovaries.    Procedure:  The patient was prepped and draped in the supine position and placed under spinal anesthesia.  A transverse incision was made across the abdomen in a Pfannenstiel manner. If indicated the old scar was systematically removed with sharp dissection.  We carried the dissection down to the level of the fascia.  The fascia was incised in a curvilinear manner.  The fascia was then elevated from the rectus muscles with blunt and sharp dissection.  The rectus muscles were separated laterally exposing the peritoneum.  The peritoneum was carefully entered with care being taken to avoid bowel and bladder.  A self-retaining retractor was placed.  The visceral peritoneum was incised in a curvilinear fashion across the lower uterine segment creating a bladder flap. A transverse incision was made across the lower uterine segment and extended laterally and superiorly using the bandage scissors.  Artificial rupture membranes was performed and Clear fluid was noted.  The infant was delivered from the breech  position by delivering the buttocks and the legs.  The lower extremities were wrapped in a moist towel and elevated the arms were swept across the chest and the head was delivered using the Marceau Smellie Vites maneuver A nuchal cord was not  present. After an appropriate time interval, the cord was doubly clamped and cut. Cord blood was obtained if required.  The infant was handed to the pediatric personnel  who then placed the infant under heat lamps where it was cleaned dried and suctioned as needed. The placenta was delivered. The hysterotomy incision was then identified on ring forceps.  The uterine cavity was cleaned with a moist lap sponge.  The hysterotomy incision was closed with a running interlocking suture of Vicryl.  Hemostasis was excellent.  Pitocin was run in the IV and the uterus was found to be firm. The posterior cul-de-sac and gutters were cleaned and inspected.  Hemostasis was noted.  The fascia was then closed with a running suture of #1 Vicryl.  Hemostasis of the subcutaneous tissues was obtained using the Bovie.  The subcutaneous tissues were closed with a running suture of 000 Vicryl.  A subcuticular suture was placed.  Steri-Strips were applied in the usual manner.  A pressure dressing was placed.  The patient went to the recovery room in stable condition.   Elonda Husky, M.D. 02/22/2019 11:35 AM

## 2019-02-22 NOTE — H&P (Signed)
History and Physical   HPI  Bethany Gutierrez is a 28 y.o. G1P0 at [redacted]w[redacted]d Estimated Date of Delivery: 03/01/19 who is being admitted for  C-section For breech OB History  OB History  Gravida Para Term Preterm AB Living  1 0 0 0 0 0  SAB TAB Ectopic Multiple Live Births  0 0 0 0 0    # Outcome Date GA Lbr Len/2nd Weight Sex Delivery Anes PTL Lv  1 Current             PROBLEM LIST  Pregnancy complications or risks: Patient Active Problem List   Diagnosis Date Noted  . Abnormal fetal lie, antepartum 02/22/2019  . Pregnancy 01/16/2019     Prenatal labs and studies: ABO, Rh: --/--/O POS Performed at [facility], [address] [place], [place] [address]  [phone]) Antibody: NEG (03/19 1204) Rubella: 3.16 (09/06 0947) RPR: Non Reactive (01/06 0917)  HBsAg: Negative (09/06 0947)  HIV: Non Reactive (09/06 0947)  GBS:    Past Medical History:  Diagnosis Date  . Asthma   . Environmental and seasonal allergies   . Family history of adverse reaction to anesthesia    mother and sister post op nausea and vomiting  . GERD (gastroesophageal reflux disease)   . Seasonal allergies      Past Surgical History:  Procedure Laterality Date  . TONSILLECTOMY       Medications    Current Discharge Medication List    CONTINUE these medications which have NOT CHANGED   Details  docusate sodium (COLACE) 100 MG capsule Take 100 mg by mouth 2 (two) times daily.    fluticasone (FLONASE) 50 MCG/ACT nasal spray USE 1 SPRAY IN EACH NOSTRIL EVERY DAY Qty: 16 g, Refills: 2    levocetirizine (XYZAL) 5 MG tablet Take 1 tablet (5 mg total) by mouth every evening. Qty: 30 tablet, Refills: 3    montelukast (SINGULAIR) 10 MG tablet Take 1 tablet (10 mg total) by mouth at bedtime. Qty: 90 tablet, Refills: 1    omeprazole (PRILOSEC) 10 MG capsule Take 10 mg by mouth daily.    Prenatal Vit-Fe Fumarate-FA (PRENATAL MULTIVITAMIN) TABS tablet Take 1 tablet by mouth daily  at 12 noon.    Fluticasone-Salmeterol (ADVAIR) 250-50 MCG/DOSE AEPB Inhale 1 puff into the lungs 2 (two) times daily.         Allergies  Levofloxacin; Bacitracin; Codeine; Prednisone; Sulfamethoxazole-trimethoprim; Tramadol; and Vicodin [hydrocodone-acetaminophen]  Review of Systems  Pertinent items are noted in HPI.  Physical Exam  BP 99/75 (BP Location: Right Arm)   Pulse 100   Temp 98.4 F (36.9 C) (Oral)   Resp 17   Ht 5\' 7"  (1.702 m)   Wt 75.3 kg   LMP 05/25/2018 (Exact Date)   SpO2 98%   BMI 26.00 kg/m   Lungs:  CTA B Cardio: RRR without M/R/G Abd: Soft, gravid, NT Presentation: breech confirmed by U/S EXT: No C/C/ 1+ Edema DTRs: 2+ B CERVIX:     See Prenatal records for more detailed PE.     FHR:  Variability: Good {> 6 bpm)  Toco: Uterine Contractions: None    Test Results  Results for orders placed or performed during the hospital encounter of 02/22/19 (from the past 24 hour(s))  ABO/Rh     Status: None   Collection Time: 02/22/19  7:30 AM  Result Value Ref Range   ABO/RH(D)      O POS Performed at [facility], [address] [place], [place] [address]      Assessment   G1P0 at [redacted]w[redacted]d Estimated Date of Delivery: 03/01/19  The fetus is reassuring.  Breech  Patient Active Problem List   Diagnosis Date Noted  . Abnormal fetal lie, antepartum 02/22/2019  . Pregnancy 01/16/2019    Plan  1. Admit to L&D :    2. EFM: -- Category 1 3. CD for Breech 4. Admission labs    Elonda Husky, M.D. 02/22/2019 9:18 AM

## 2019-02-23 MED ORDER — IBUPROFEN 600 MG PO TABS
600.0000 mg | ORAL_TABLET | Freq: Four times a day (QID) | ORAL | Status: DC
  Administered 2019-02-23 – 2019-02-24 (×4): 600 mg via ORAL
  Filled 2019-02-23 (×4): qty 1

## 2019-02-23 MED ORDER — COCONUT OIL OIL
1.0000 "application " | TOPICAL_OIL | Status: DC | PRN
  Administered 2019-02-23: 1 via TOPICAL
  Filled 2019-02-23: qty 120

## 2019-02-23 NOTE — Anesthesia Postprocedure Evaluation (Signed)
Anesthesia Post Note  Patient: Bethany Gutierrez  Procedure(Gutierrez) Performed: CESAREAN SECTION (N/A )  Patient location during evaluation: PACU Anesthesia Type: Spinal Level of consciousness: oriented and awake and alert Pain management: pain level controlled Vital Signs Assessment: post-procedure vital signs reviewed and stable Respiratory status: spontaneous breathing, respiratory function stable and patient connected to nasal cannula oxygen Cardiovascular status: blood pressure returned to baseline and stable Postop Assessment: no headache, no backache and no apparent nausea or vomiting Anesthetic complications: no     Last Vitals:  Vitals:   02/23/19 0614 02/23/19 0800  BP:  109/75  Pulse: 79 86  Resp:  18  Temp:  36.9 C  SpO2: 97% 97%    Last Pain:  Vitals:   02/23/19 0930  TempSrc:   PainSc: 2                  Bethany Gutierrez

## 2019-02-23 NOTE — Plan of Care (Signed)
Patient's vital signs stable; fundus firm; small amount rubra lochia; good appetite; good po fluids; voided; breastfeeding with good technique observed; good maternal-infant bonding observed; patient's husband at bedside and attentive.

## 2019-02-23 NOTE — Progress Notes (Signed)
Patient ID: Bethany Gutierrez, female   DOB: 04-27-1991, 28 y.o.   MRN: [id number]    Progress Note - Cesarean Delivery  Bethany Gutierrez is a 28 y.o. G1P1001 now PP day 1 s/p C-Section, Low Transverse .   Subjective:  Patient reports no problems with eating, bowel movements, voiding, or their wound - pain controlled.  Breast feeding.  Objective:  Vital signs in last 24 hours: Temp:  [97.8 F (36.6 C)-99.1 F (37.3 C)] 98.4 F (36.9 C) (03/21 0800) Pulse Rate:  [71-94] 86 (03/21 0800) Resp:  [16-23] 18 (03/21 0800) BP: (87-119)/(67-82) 109/75 (03/21 0800) SpO2:  [94 %-99 %] 97 % (03/21 0800)  Physical Exam:  General: alert, cooperative and no distress Lochia: appropriate Uterine Fundus: firm Incision: dressing intact dry DVT Evaluation: No evidence of DVT seen on physical exam.    Data Review Recent Labs    02/21/19 1204  HGB 12.1  HCT 36.6    Assessment:  Active Problems:   Abnormal fetal lie, antepartum   Status post Cesarean section. Doing well postoperatively.   Plan:       Continue current care.  Probable discharge tomorrow.  Bethany Gutierrez, M.D. 02/23/2019 11:06 AM

## 2019-02-23 NOTE — Lactation Note (Signed)
This note was copied from a baby's chart. Lactation Consultation Note  Patient Name: Bethany Gutierrez [id number] Date: 02/23/2019 Reason for consult: Follow-up assessment;Mother's request;Primapara;Term;Nipple pain/trauma;Other (Comment)(Cluster feeding) Observed mom breast feeding Palmer on right breast in football hold using #20 nipple shield with strong rhythmic sucking and swallowing.  Encouraged mom to start trying to put Palmer to the breast without nipple shield, especially since mom's nipples are everted.  Mom reports that he will not latch without nipple shield and since introducing the nipple shield, he is maintaining the latch better and sucks longer.  Mom reports Rubye Oaks has been cluster feeding for last few hours.  He fell asleep after 30 minutes of vigorous suckling and swallowing at the breast.  After he was weighed, he started demonstrating feeding cues again.  Reviewed supply and demand, normal course of lactation and routine newborn feeding patterns.  Mom reports tender nipples.  Faint positional stripe in center of both nipples from where mom breast feeds in same football hold for each feeding.  Mom has tried other positions, but states, "this position works better for her and baby for now".  When hand expressed, mom expressed copious amounts of colostrum after breast feeding.  Encouraged mom to rub on nipples to prevent growth of bacteria, for lubrication and for comfort.  She has already been given coconut oil and comfort gels.  Reviewed alternating use.  Lactation name and number are on white board and encouraged to call with any questions, concerns or assistance.    Maternal Data Formula Feeding for Exclusion: No Has patient been taught Hand Expression?: Yes(Can easily hand express lots of colostrum) Does the patient have breastfeeding experience prior to this delivery?: No  Feeding Feeding Type: Breast Fed  LATCH Score Latch: Grasps breast easily, tongue down, lips flanged,  rhythmical sucking.  Audible Swallowing: A few with stimulation  Type of Nipple: Everted at rest and after stimulation  Comfort (Breast/Nipple): Filling, red/small blisters or bruises, mild/mod discomfort  Hold (Positioning): No assistance needed to correctly position infant at breast.  LATCH Score: 8  Interventions Interventions: Breast feeding basics reviewed;Skin to skin;Breast massage;Breast compression;Adjust position;Support pillows;Position options;Coconut oil;Comfort gels  Lactation Tools Discussed/Used Tools: Coconut oil;Comfort gels;Nipple Shields Nipple shield size: 20 WIC Program: [employer])   Consult Status Consult Status: PRN Follow-up type: Call as needed    Louis Meckel 02/23/2019, 10:02 PM

## 2019-02-24 ENCOUNTER — Encounter: Payer: Self-pay | Admitting: Obstetrics and Gynecology

## 2019-02-24 MED ORDER — SIMETHICONE 80 MG PO CHEW: 80.0000 mg | CHEWABLE_TABLET | Freq: Four times a day (QID) | ORAL | 0 refills | Status: DC

## 2019-02-24 MED ORDER — OXYCODONE-ACETAMINOPHEN 5-325 MG PO TABS: 1.0000 | ORAL_TABLET | ORAL | 0 refills | Status: DC | PRN

## 2019-02-24 MED ORDER — NORETHINDRONE 0.35 MG PO TABS: 1.0000 | ORAL_TABLET | Freq: Every day | ORAL | 11 refills | Status: DC

## 2019-02-24 MED ORDER — IBUPROFEN 600 MG PO TABS: 600.0000 mg | ORAL_TABLET | Freq: Four times a day (QID) | ORAL | 0 refills | Status: DC

## 2019-02-24 NOTE — Progress Notes (Signed)
Pt discharged with infant.  Discharge instructions, prescriptions and follow up appointment given to and reviewed with pt. Pt verbalized understanding. Escorted out by staff. 

## 2019-02-24 NOTE — Final Progress Note (Signed)
Discharge Day SOAP Note:  Subjective:  The patient has no complaints.  She is ambulating well. She is taking PO well. Pain is well controlled with current medications. Patient is urinating without difficulty.   She is passing flatus.    Objective  Vital signs in last 24 hours: BP 111/86 (BP Location: Left Arm)   Pulse 71   Temp 98.4 F (36.9 C) (Oral)   Resp 20   Ht 5\' 7"  (1.702 m)   Wt 75.3 kg   LMP 05/25/2018 (Exact Date)   SpO2 100%   Breastfeeding Unknown   BMI 26.00 kg/m   Physical Exam: Gen: NAD Abdomen:  clean, dry, no drainage, healing Fundus Fundal Tone: Firm  Lochia Amount: Small     Data Review Labs: CBC Latest Ref Rng & Units 02/21/2019 12/10/2018 08/10/2018  WBC 4.0 - 10.5 K/uL 11.5(H) 9.7 6.6  Hemoglobin 12.0 - 15.0 g/dL 30.1 31.4 38.8  Hematocrit 36.0 - 46.0 % 36.6 33.5(L) 37.9  Platelets 150 - 400 K/uL 250 228 -   O POS Performed at [facility], [address] [place], [place] [address]   Assessment:  Active Problems:   Abnormal fetal lie, antepartum   Doing well.  Normal progress as expected.     Plan:  Discharge to home  Modified rest as directed - may slowly resume normal activities with restrictions  as discussed.  Medications as written.  See below for additional.       Discharge Instructions: Per After Visit Summary. Activity: Advance as tolerated. Pelvic rest for 6 weeks.  Also refer to After Visit Summary.  Wound care discussed. Diet: Regular Medications: Allergies as of 02/24/2019      Reactions   Levofloxacin Other (See Comments)   She states it kept her up all night and gave her a really bad migraine.   Bacitracin Hives   Codeine Nausea And Vomiting   rash   Prednisone Other (See Comments)   Sensative Sensative   Sulfamethoxazole-trimethoprim Nausea And Vomiting, Other (See Comments)   Nausea and vomiting Nausea and vomiting   Tramadol Nausea And Vomiting   Vicodin [hydrocodone-acetaminophen] Nausea  And Vomiting, Rash      Medication List    TAKE these medications   docusate sodium 100 MG capsule Commonly known as:  COLACE Take 100 mg by mouth 2 (two) times daily.   fluticasone 50 MCG/ACT nasal spray Commonly known as:  FLONASE USE 1 SPRAY IN EACH NOSTRIL EVERY DAY   Fluticasone-Salmeterol 250-50 MCG/DOSE Aepb Commonly known as:  ADVAIR Inhale 1 puff into the lungs 2 (two) times daily.   ibuprofen 600 MG tablet Commonly known as:  ADVIL,MOTRIN Take 1 tablet (600 mg total) by mouth every 6 (six) hours.   levocetirizine 5 MG tablet Commonly known as:  XYZAL Take 1 tablet (5 mg total) by mouth every evening.   montelukast 10 MG tablet Commonly known as:  SINGULAIR Take 1 tablet (10 mg total) by mouth at bedtime.   norethindrone 0.35 MG tablet Commonly known as:  MICRONOR,CAMILA,ERRIN Take 1 tablet (0.35 mg total) by mouth daily. Start 4 wks postpartum   omeprazole 10 MG capsule Commonly known as:  PRILOSEC Take 10 mg by mouth daily.   oxyCODONE-acetaminophen 5-325 MG tablet Commonly known as:  PERCOCET/ROXICET Take 1-2 tablets by mouth every 4 (four) hours as needed for moderate pain.   prenatal multivitamin Tabs tablet Take 1 tablet by mouth daily at 12 noon.   simethicone 80 MG chewable tablet Commonly  known as:  MYLICON Chew 1 tablet (80 mg total) by mouth 4 (four) times daily.      Outpatient follow up: 1 wk in office with Doreene Burke, CNM Postpartum contraception: Progestin only pill. Start 4 wks postpartum   Discharged Condition: good  Discharged to: home  Newborn Data: Disposition:home with mother  Apgars: APGAR (1 MIN): 8   APGAR (5 MINS): 9   APGAR (10 MINS):    Baby Feeding: Breast  Doreene Burke, CNM  02/24/2019  9:09 AM

## 2019-02-24 NOTE — Discharge Summary (Signed)
Physician Obstetric Discharge Summary  Patient ID: Bethany Gutierrez MRN: [id number] DOB/AGE: Nov 18, 1991 27 y.o.   Date of Admission: 02/22/2019  Date of Discharge: 02/24/2019  Admitting Diagnosis: Scheduled cesarean section at [redacted]w[redacted]d for breech presentation  Mode of Delivery: primary cesarean section            Discharge Diagnosis: No other diagnosis   Intrapartum Procedures: none   Post partum procedures: none  Complications: none                        Discharge Day SOAP Note:  Subjective:  The patient has no complaints.  She is ambulating well. She is taking PO well. Pain is well controlled with current medications. Patient is urinating without difficulty.   She is passing flatus.    Objective  Vital signs in last 24 hours: BP 111/86 (BP Location: Left Arm)   Pulse 71   Temp 98.4 F (36.9 C) (Oral)   Resp 20   Ht 5\' 7"  (1.702 m)   Wt 75.3 kg   LMP 05/25/2018 (Exact Date)   SpO2 100%   Breastfeeding Unknown   BMI 26.00 kg/m   Physical Exam: Gen: NAD Abdomen:  clean, dry, no drainage, healing Fundus Fundal Tone: Firm  Lochia Amount: Small     Data Review Labs: CBC Latest Ref Rng & Units 02/21/2019 12/10/2018 08/10/2018  WBC 4.0 - 10.5 K/uL 11.5(H) 9.7 6.6  Hemoglobin 12.0 - 15.0 g/dL 75.4 36.0 67.7  Hematocrit 36.0 - 46.0 % 36.6 33.5(L) 37.9  Platelets 150 - 400 K/uL 250 228 -   O POS Performed at [facility], [address] [place], [place] [address]   Assessment:  Active Problems:   Abnormal fetal lie, antepartum   Doing well.  Normal progress as expected.     Plan:  Discharge to home  Modified rest as directed - may slowly resume normal activities with restrictions  as discussed.  Medications as written.  See below for additional.       Discharge Instructions: Per After Visit Summary. Activity: Advance as tolerated. Pelvic rest for 6 weeks.  Also refer to After Visit Summary.  Wound care discussed. Diet:  Regular Medications: Allergies as of 02/24/2019      Reactions   Levofloxacin Other (See Comments)   She states it kept her up all night and gave her a really bad migraine.   Bacitracin Hives   Codeine Nausea And Vomiting   rash   Prednisone Other (See Comments)   Sensative Sensative   Sulfamethoxazole-trimethoprim Nausea And Vomiting, Other (See Comments)   Nausea and vomiting Nausea and vomiting   Tramadol Nausea And Vomiting   Vicodin [hydrocodone-acetaminophen] Nausea And Vomiting, Rash      Medication List    TAKE these medications   docusate sodium 100 MG capsule Commonly known as:  COLACE Take 100 mg by mouth 2 (two) times daily.   fluticasone 50 MCG/ACT nasal spray Commonly known as:  FLONASE USE 1 SPRAY IN EACH NOSTRIL EVERY DAY   Fluticasone-Salmeterol 250-50 MCG/DOSE Aepb Commonly known as:  ADVAIR Inhale 1 puff into the lungs 2 (two) times daily.   ibuprofen 600 MG tablet Commonly known as:  ADVIL,MOTRIN Take 1 tablet (600 mg total) by mouth every 6 (six) hours.   levocetirizine 5 MG tablet Commonly known as:  XYZAL Take 1 tablet (5 mg total) by mouth every evening.   montelukast 10 MG tablet Commonly known as:  SINGULAIR  Take 1 tablet (10 mg total) by mouth at bedtime.   norethindrone 0.35 MG tablet Commonly known as:  MICRONOR,CAMILA,ERRIN Take 1 tablet (0.35 mg total) by mouth daily. Start 4 wks postpartum   omeprazole 10 MG capsule Commonly known as:  PRILOSEC Take 10 mg by mouth daily.   oxyCODONE-acetaminophen 5-325 MG tablet Commonly known as:  PERCOCET/ROXICET Take 1-2 tablets by mouth every 4 (four) hours as needed for moderate pain.   prenatal multivitamin Tabs tablet Take 1 tablet by mouth daily at 12 noon.   simethicone 80 MG chewable tablet Commonly known as:  MYLICON Chew 1 tablet (80 mg total) by mouth 4 (four) times daily.      Outpatient follow up: 1 wk in office with Doreene Burke, CNM Postpartum contraception:  Progestin only pill. Start 4 wks postpartum   Discharged Condition: good  Discharged to: home  Newborn Data: Disposition:home with mother  Apgars: APGAR (1 MIN): 8   APGAR (5 MINS): 9   APGAR (10 MINS):    Baby Feeding: Breast  Doreene Burke, CNM  02/24/2019  9:09 AM

## 2019-02-28 NOTE — Progress Notes (Signed)
Coronavirus (COVID-19) Are you at risk?  Are you at risk for the Coronavirus (COVID-19)?  To be considered HIGH RISK for Coronavirus (COVID-19), you have to meet the following criteria:  . Traveled to [place], [place], [place], [place] or [place]; or in the [place] to [place], [place], [place], or [place]; and have fever, cough, and shortness of breath within the last 2 weeks of travel OR . Been in close contact with a person diagnosed with COVID-19 within the last 2 weeks and have fever, cough, and shortness of breath . IF YOU DO NOT MEET THESE CRITERIA, YOU ARE CONSIDERED LOW RISK FOR COVID-19.  What to do if you are HIGH RISK for COVID-19?  . If you are having a medical emergency, call 911. . Seek medical care right away. Before you go to a doctor's office, urgent care or emergency department, call ahead and tell them about your recent travel, contact with someone diagnosed with COVID-19, and your symptoms. You should receive instructions from your physician's office regarding next steps of care.  . When you arrive at healthcare provider, tell the healthcare staff immediately you have returned from visiting [place], [place], [place], [place] or [place]; or traveled in the [place] to [place], [place], [place], or [place]; in the last two weeks or you have been in close contact with a person diagnosed with COVID-19 in the last 2 weeks.   . Tell the health care staff about your symptoms: fever, cough and shortness of breath. . After you have been seen by a medical provider, you will be either: o Tested for (COVID-19) and discharged home on quarantine except to seek medical care if symptoms worsen, and asked to  - Stay home and avoid contact with others until you get your results (4-5 days)  - Avoid travel on public transportation if possible (such as bus, train, or airplane) or o Sent to the Emergency Department by EMS for evaluation, COVID-19 testing, and possible  admission depending on your condition and test results.  What to do if you are LOW RISK for COVID-19?  Reduce your risk of any infection by using the same precautions used for avoiding the common cold or flu:  . Wash your hands often with soap and warm water for at least 20 seconds.  If soap and water are not readily available, use an alcohol-based hand sanitizer with at least 60% alcohol.  . If coughing or sneezing, cover your mouth and nose by coughing or sneezing into the elbow areas of your shirt or coat, into a tissue or into your sleeve (not your hands). . Avoid shaking hands with others and consider head nods or verbal greetings only. . Avoid touching your eyes, nose, or mouth with unwashed hands.  . Avoid close contact with people who are sick. . Avoid places or events with large numbers of people in one location, like concerts or sporting events. . Carefully consider travel plans you have or are making. . If you are planning any travel outside or inside the [place], visit the CDC's Travelers' Health webpage for the latest health notices. . If you have some symptoms but not all symptoms, continue to monitor at home and seek medical attention if your symptoms worsen. . If you are having a medical emergency, call 911.  Spoke with pt denies any sx. Zale Marcotte, CMA   ADDITIONAL HEALTHCARE OPTIONS FOR PATIENTS  [facility] / e-Visit: [email or web address]           [facility]: [phone]  [facility]: [phone]                   [facility]: [phone]  

## 2019-03-01 ENCOUNTER — Ambulatory Visit (INDEPENDENT_AMBULATORY_CARE_PROVIDER_SITE_OTHER): Payer: BLUE CROSS/BLUE SHIELD | Admitting: Certified Nurse Midwife

## 2019-03-01 ENCOUNTER — Other Ambulatory Visit: Payer: Self-pay

## 2019-03-01 VITALS — BP 105/76 | HR 80 | Ht 67.0 in | Wt 151.2 lb

## 2019-03-01 DIAGNOSIS — Z98891 History of uterine scar from previous surgery: Secondary | ICD-10-CM

## 2019-03-01 DIAGNOSIS — Z5189 Encounter for other specified aftercare: Secondary | ICD-10-CM

## 2019-03-01 NOTE — Progress Notes (Signed)
    OBSTETRICS/GYNECOLOGY POST-OPERATIVE CLINIC VISIT  Subjective:     Bethany Gutierrez is a 28 y.o. female who presents to the clinic 1 week status post primary cesarean section for fetal malpresentation. Eating a regular diet without difficulty. Bowel movements are normal. Pain is controlled with current analgesics. Medications being used: ibuprofen (OTC) and narcotic analgesics including Percocet.   Denies difficulty breathing or respiratory distress, chest pain, abdominal pain, excessive vaginal bleeding, dysuria, and leg pain or swelling.   The following portions of the patient's history were reviewed and updated as appropriate: allergies, current medications, past family history, past medical history, past social history, past surgical history and problem list.  Review of Systems  Pertinent items are noted in HPI.   Objective:    BP 105/76   Pulse 80   Ht 5\' 7"  (1.702 m)   Wt 151 lb 3 oz (68.6 kg)   Breastfeeding Yes   BMI 23.68 kg/m    General:  alert and no distress  Abdomen: soft, bowel sounds active, non-tender  Incision:   healing well, no drainage, no erythema, no hernia, no seroma, no swelling, no dehiscence, incision well approximated    Assessment:   Doing well postoperatively.   Plan:   Continue any current medications.  Wound care discussed.  Activity restrictions: no lifting more than 10 pounds.  Anticipated return to work: not applicable.  Follow up: 5 weeks for postpartum visit.    Gunnar Bulla, CNM [facility]

## 2019-03-01 NOTE — Patient Instructions (Addendum)
Home Care Instructions for Mom  Activity  · Gradually return to your regular activities.  · Let yourself rest. Nap while your baby sleeps.  · Avoid lifting anything that is heavier than 10 lb (4.5 kg) until your health care provider says it is okay.  · Avoid activities that take a lot of effort and energy (are strenuous) until approved by your health care provider. Walking at a slow-to-moderate pace is usually safe.  · If you had a cesarean delivery:  ? Do not vacuum, climb stairs, or drive a car for 4-6 weeks.  ? Have someone help you at home until you feel like you can do your usual activities yourself.  ? Do exercises as told by your health care provider, if this applies.  Vaginal bleeding  You may continue to bleed for 4-6 weeks after delivery. Over time, the amount of blood usually decreases and the color of the blood usually gets lighter. However, the flow of bright red blood may increase if you have been too active. If you need to use more than one pad in an hour because your pad gets soaked, or if you pass a large clot:  · Lie down.  · Raise your feet.  · Place a cold compress on your lower abdomen.  · Rest.  · Call your health care provider.  If you are breastfeeding, your period should return anytime between 8 weeks after delivery and the time that you stop breastfeeding. If you are not breastfeeding, your period should return 6-8 weeks after delivery.  Perineal care  The perineal area, or perineum, is the part of your body between your thighs. After delivery, this area needs special care. Follow these instructions as told by your health care provider.  · Take warm tub baths for 15-20 minutes.  · Use medicated pads and pain-relieving sprays and creams as told.  · Do not use tampons or douches until vaginal bleeding has stopped.  · Each time you go to the bathroom:  ? Use a peri bottle.  ? Change your pad.  ? Use towelettes in place of toilet paper until your stitches have healed.  · Do Kegel exercises  every day. Kegel exercises help to maintain the muscles that support the vagina, bladder, and bowels. You can do these exercises while you are standing, sitting, or lying down. To do Kegel exercises:  ? Tighten the muscles of your abdomen and the muscles that surround your birth canal.  ? Hold for a few seconds.  ? Relax.  ? Repeat until you have done this 5 times in a row.  · To prevent hemorrhoids from developing or getting worse:  ? Drink enough fluid to keep your urine clear or pale yellow.  ? Avoid straining when having a bowel movement.  ? Take over-the-counter medicines and stool softeners as told by your health care provider.  Breast care  · Wear a tight-fitting bra.  · Avoid taking over-the-counter pain medicine for breast discomfort.  · Apply ice to the breasts to help with discomfort as needed:  ? Put ice in a plastic bag.  ? Place a towel between your skin and the bag.  ? Leave the ice on for 20 minutes or as told by your health care provider.  Nutrition  · Eat a well-balanced diet.  · Do not try to lose weight quickly by cutting back on calories.  · Take your prenatal vitamins until your postpartum checkup or until your health care provider tells   you to stop.  Postpartum depression  You may find yourself crying for no apparent reason and unable to cope with all of the changes that come with having a newborn. This mood is called postpartum depression. Postpartum depression happens because your hormone levels change after delivery. If you have postpartum depression, get support from your partner, friends, and family. If the depression does not go away on its own after several weeks, contact your health care provider.  Breast self-exam    Do a breast self-exam each month, at the same time of the month. If you are breastfeeding, check your breasts just after a feeding, when your breasts are less full. If you are breastfeeding and your period has started, check your breasts on day 5, 6, or 7 of your  period.  Report any lumps, bumps, or discharge to your health care provider. Know that breasts are normally lumpy if you are breastfeeding. This is temporary, and it is not a health risk.  Intimacy and sexuality  Avoid sexual activity for at least 3-4 weeks after delivery or until the brownish-red vaginal flow is completely gone. If you want to avoid pregnancy, use some form of birth control. You can get pregnant after delivery, even if you have not had your period.  Contact a health care provider if:  · You feel unable to cope with the changes that a child brings to your life, and these feelings do not go away after several weeks.  · You notice a lump, a bump, or discharge on your breast.  Get help right away if:  · Blood soaks your pad in 1 hour or less.  · You have:  ? Severe pain or cramping in your lower abdomen.  ? A bad-smelling vaginal discharge.  ? A fever that is not controlled by medicine.  ? A fever, and an area of your breast is red and sore.  ? Pain or redness in your calf.  ? Sudden, severe chest pain.  ? Shortness of breath.  ? Painful or bloody urination.  ? Problems with your vision.  ? You vomit for 12 hours or longer.  ? You develop a severe headache.  ? You have serious thoughts about hurting yourself, your child, or anyone else.  This information is not intended to replace advice given to you by your health care provider. Make sure you discuss any questions you have with your health care provider.  Document Released: 11/18/2000 Document Revised: 01/17/2018 Document Reviewed: 05/25/2015  Elsevier Interactive Patient Education © 2019 Elsevier Inc.

## 2019-03-29 ENCOUNTER — Encounter: Payer: BLUE CROSS/BLUE SHIELD | Admitting: Certified Nurse Midwife

## 2019-04-03 ENCOUNTER — Telehealth: Payer: Self-pay

## 2019-04-03 NOTE — Telephone Encounter (Signed)
Coronavirus (COVID-19) Are you at risk?  Are you at risk for the Coronavirus (COVID-19)?  To be considered HIGH RISK for Coronavirus (COVID-19), you have to meet the following criteria:  . Traveled to [place], [place], [place], [place] or [place]; or in the [place] to [place], [place], [place], or [place]; and have fever, cough, and shortness of breath within the last 2 weeks of travel OR . Been in close contact with a person diagnosed with COVID-19 within the last 2 weeks and have fever, cough, and shortness of breath . IF YOU DO NOT MEET THESE CRITERIA, YOU ARE CONSIDERED LOW RISK FOR COVID-19.  What to do if you are HIGH RISK for COVID-19?  Marland Kitchen If you are having a medical emergency, call 911. . Seek medical care right away. Before you go to a doctor's office, urgent care or emergency department, call ahead and tell them about your recent travel, contact with someone diagnosed with COVID-19, and your symptoms. You should receive instructions from your physician's office regarding next steps of care.  . When you arrive at healthcare provider, tell the healthcare staff immediately you have returned from visiting [place], [place], [place], [place] or [place]; or traveled in the [place] to [place], [place], [place], or [place]; in the last two weeks or you have been in close contact with a person diagnosed with COVID-19 in the last 2 weeks.   . Tell the health care staff about your symptoms: fever, cough and shortness of breath. . After you have been seen by a medical provider, you will be either: o Tested for (COVID-19) and discharged home on quarantine except to seek medical care if symptoms worsen, and asked to  - Stay home and avoid contact with others until you get your results (4-5 days)  - Avoid travel on public transportation if possible (such as bus, train, or airplane) or o Sent to the Emergency Department by EMS for evaluation, COVID-19 testing, and possible  admission depending on your condition and test results.  What to do if you are LOW RISK for COVID-19?  Reduce your risk of any infection by using the same precautions used for avoiding the common cold or flu:  Marland Kitchen Wash your hands often with soap and warm water for at least 20 seconds.  If soap and water are not readily available, use an alcohol-based hand sanitizer with at least 60% alcohol.  . If coughing or sneezing, cover your mouth and nose by coughing or sneezing into the elbow areas of your shirt or coat, into a tissue or into your sleeve (not your hands). . Avoid shaking hands with others and consider head nods or verbal greetings only. . Avoid touching your eyes, nose, or mouth with unwashed hands.  . Avoid close contact with people who are Mekhi Lascola. . Avoid places or events with large numbers of people in one location, like concerts or sporting events. . Carefully consider travel plans you have or are making. . If you are planning any travel outside or inside the [place], visit the CDC's Travelers' Health webpage for the latest health notices. . If you have some symptoms but not all symptoms, continue to monitor at home and seek medical attention if your symptoms worsen. . If you are having a medical emergency, call 911.  04/03/19 SCREENING NEG SLS ADDITIONAL HEALTHCARE OPTIONS FOR PATIENTS  [facility] / e-Visit: [email or web address]         [facility]: [phone]  [facility]: [phone]                   [facility]: [phone]  

## 2019-04-04 ENCOUNTER — Ambulatory Visit (INDEPENDENT_AMBULATORY_CARE_PROVIDER_SITE_OTHER): Payer: BLUE CROSS/BLUE SHIELD | Admitting: Certified Nurse Midwife

## 2019-04-04 ENCOUNTER — Telehealth: Payer: Self-pay | Admitting: *Deleted

## 2019-04-04 ENCOUNTER — Encounter: Payer: Self-pay | Admitting: Certified Nurse Midwife

## 2019-04-04 ENCOUNTER — Other Ambulatory Visit: Payer: Self-pay

## 2019-04-04 NOTE — Progress Notes (Signed)
Subjective:    Bethany Gutierrez is a 28 y.o. G52P1001 African American female who presents for a postpartum visit. She is 6 weeks postpartum following a primary cesarean section, low transverse incision at 39+0 gestational weeks. Anesthesia: spinal. I have fully reviewed the prenatal and intrapartum course.   Postpartum course has been uncomplicated. Baby's course has been uncomplicated. Baby is feeding by breast with formula supplementation. Bleeding no bleeding. Bowel function is normal. Bladder function is normal.   Patient is sexually active. Contraception method is oral progesterone-only contraceptive. Postpartum depression screening: negative. Score 0.  Last pap 08/2018 and was negative.  Denies difficulty breathing or respiratory distress, chest pain, abdominal pain, excessive vaginal bleeding, dysuria, and leg pain or swelling.   The following portions of the patient's history were reviewed and updated as appropriate: allergies, current medications, past medical history, past surgical history and problem list.  Review of Systems  Pertinent items are noted in HPI.   Objective:   BP 97/76   Pulse 73   Ht 5\' 7"  (1.702 m)   Wt 149 lb 4.8 oz (67.7 kg)   Breastfeeding Yes   BMI 23.38 kg/m   General:  alert, cooperative and no distress   Breasts:  deferred, no complaints  Lungs: clear to auscultation bilaterally  Heart:  regular rate and rhythm  Abdomen: soft, nontender   Vulva: normal  Vagina: normal vagina  Cervix:  closed  Corpus: Well-involuted  Adnexa:  Non-palpable    Depression screen [facility] 2/9 04/04/2019  Decreased Interest 0  Down, Depressed, Hopeless 0  PHQ - 2 Score 0  Altered sleeping 0  Tired, decreased energy 0  Change in appetite 0  Feeling bad or failure about yourself  0  Trouble concentrating 0  Moving slowly or fidgety/restless 0  Suicidal thoughts 0  PHQ-9 Score 0        Assessment:   Postpartum exam Six (6) wks s/p primary cesarean section for  fetal malpresentation Breastfeeding with formula supplementation Depression screening Contraception counseling   Plan:   Encouraged routine health maintenance techniques.   Reviewed red flag symptoms and when to call.   Follow up in: 7 months for ANNUAL EXAM or earlier if needed.   Gunnar Bulla, CNM [facility], [facility] 04/04/19 2:06 PM

## 2019-04-04 NOTE — Patient Instructions (Signed)
Preventive Care 18-39 Years, Female Preventive care refers to lifestyle choices and visits with your health care provider that can promote health and wellness. What does preventive care include?   A yearly physical exam. This is also called an annual well check.  Dental exams once or twice a year.  Routine eye exams. Ask your health care provider how often you should have your eyes checked.  Personal lifestyle choices, including: ? Daily care of your teeth and gums. ? Regular physical activity. ? Eating a healthy diet. ? Avoiding tobacco and drug use. ? Limiting alcohol use. ? Practicing safe sex. ? Taking vitamin and mineral supplements as recommended by your health care provider. What happens during an annual well check? The services and screenings done by your health care provider during your annual well check will depend on your age, overall health, lifestyle risk factors, and family history of disease. Counseling Your health care provider may ask you questions about your:  Alcohol use.  Tobacco use.  Drug use.  Emotional well-being.  Home and relationship well-being.  Sexual activity.  Eating habits.  Work and work environment.  Method of birth control.  Menstrual cycle.  Pregnancy history. Screening You may have the following tests or measurements:  Height, weight, and BMI.  Diabetes screening. This is done by checking your blood sugar (glucose) after you have not eaten for a while (fasting).  Blood pressure.  Lipid and cholesterol levels. These may be checked every 5 years starting at age 20.  Skin check.  Hepatitis C blood test.  Hepatitis B blood test.  Sexually transmitted disease (STD) testing.  BRCA-related cancer screening. This may be done if you have a family history of breast, ovarian, tubal, or peritoneal cancers.  Pelvic exam and Pap test. This may be done every 3 years starting at age 21. Starting at age 30, this may be done every 5  years if you have a Pap test in combination with an HPV test. Discuss your test results, treatment options, and if necessary, the need for more tests with your health care provider. Vaccines Your health care provider may recommend certain vaccines, such as:  Influenza vaccine. This is recommended every year.  Tetanus, diphtheria, and acellular pertussis (Tdap, Td) vaccine. You may need a Td booster every 10 years.  Varicella vaccine. You may need this if you have not been vaccinated.  HPV vaccine. If you are 26 or younger, you may need three doses over 6 months.  Measles, mumps, and rubella (MMR) vaccine. You may need at least one dose of MMR. You may also need a second dose.  Pneumococcal 13-valent conjugate (PCV13) vaccine. You may need this if you have certain conditions and were not previously vaccinated.  Pneumococcal polysaccharide (PPSV23) vaccine. You may need one or two doses if you smoke cigarettes or if you have certain conditions.  Meningococcal vaccine. One dose is recommended if you are age 19-21 years and a first-year college student living in a residence hall, or if you have one of several medical conditions. You may also need additional booster doses.  Hepatitis A vaccine. You may need this if you have certain conditions or if you travel or work in places where you may be exposed to hepatitis A.  Hepatitis B vaccine. You may need this if you have certain conditions or if you travel or work in places where you may be exposed to hepatitis B.  Haemophilus influenzae type b (Hib) vaccine. You may need this if you   have certain risk factors. Talk to your health care provider about which screenings and vaccines you need and how often you need them. This information is not intended to replace advice given to you by your health care provider. Make sure you discuss any questions you have with your health care provider. Document Released: 01/17/2002 Document Revised: 07/04/2017  Document Reviewed: 09/22/2015 Elsevier Interactive Patient Education  2019 [employer].

## 2019-04-04 NOTE — Telephone Encounter (Signed)
Coronavirus (COVID-19) Are you at risk?  Are you at risk for the Coronavirus (COVID-19)?  To be considered HIGH RISK for Coronavirus (COVID-19), you have to meet the following criteria:  . Traveled to [place], [place], [place], [place] or [place]; or in the [place] to [place], [place], [place], or [place]; and have fever, cough, and shortness of breath within the last 2 weeks of travel OR . Been in close contact with a person diagnosed with COVID-19 within the last 2 weeks and have fever, cough, and shortness of breath . IF YOU DO NOT MEET THESE CRITERIA, YOU ARE CONSIDERED LOW RISK FOR COVID-19.  What to do if you are HIGH RISK for COVID-19?  . If you are having a medical emergency, call 911. . Seek medical care right away. Before you go to a doctor's office, urgent care or emergency department, call ahead and tell them about your recent travel, contact with someone diagnosed with COVID-19, and your symptoms. You should receive instructions from your physician's office regarding next steps of care.  . When you arrive at healthcare provider, tell the healthcare staff immediately you have returned from visiting [place], [place], [place], [place] or [place]; or traveled in the [place] to [place], [place], [place], or [place]; in the last two weeks or you have been in close contact with a person diagnosed with COVID-19 in the last 2 weeks.   . Tell the health care staff about your symptoms: fever, cough and shortness of breath. . After you have been seen by a medical provider, you will be either: o Tested for (COVID-19) and discharged home on quarantine except to seek medical care if symptoms worsen, and asked to  - Stay home and avoid contact with others until you get your results (4-5 days)  - Avoid travel on public transportation if possible (such as bus, train, or airplane) or o Sent to the Emergency Department by EMS for evaluation, COVID-19 testing, and possible  admission depending on your condition and test results.  What to do if you are LOW RISK for COVID-19?  Reduce your risk of any infection by using the same precautions used for avoiding the common cold or flu:  . Wash your hands often with soap and warm water for at least 20 seconds.  If soap and water are not readily available, use an alcohol-based hand sanitizer with at least 60% alcohol.  . If coughing or sneezing, cover your mouth and nose by coughing or sneezing into the elbow areas of your shirt or coat, into a tissue or into your sleeve (not your hands). . Avoid shaking hands with others and consider head nods or verbal greetings only. . Avoid touching your eyes, nose, or mouth with unwashed hands.  . Avoid close contact with people who are sick. . Avoid places or events with large numbers of people in one location, like concerts or sporting events. . Carefully consider travel plans you have or are making. . If you are planning any travel outside or inside the [place], visit the CDC's Travelers' Health webpage for the latest health notices. . If you have some symptoms but not all symptoms, continue to monitor at home and seek medical attention if your symptoms worsen. . If you are having a medical emergency, call 911.   ADDITIONAL HEALTHCARE OPTIONS FOR PATIENTS  [facility] / e-Visit: [email or web address]         [facility]: [phone]  [facility]: [phone]                   [facility]: [phone]   Spoke with pt denies any sx.  Amy Clontz, CMA 

## 2019-05-16 ENCOUNTER — Other Ambulatory Visit: Payer: Self-pay | Admitting: Certified Nurse Midwife

## 2019-05-31 ENCOUNTER — Encounter: Payer: Self-pay | Admitting: Certified Nurse Midwife

## 2019-05-31 ENCOUNTER — Other Ambulatory Visit: Payer: Self-pay

## 2019-05-31 MED ORDER — METOCLOPRAMIDE HCL 10 MG PO TABS: 10.0000 mg | ORAL_TABLET | Freq: Four times a day (QID) | ORAL | 0 refills | Status: DC

## 2019-06-13 ENCOUNTER — Encounter: Payer: Self-pay | Admitting: Certified Nurse Midwife

## 2019-06-13 NOTE — Telephone Encounter (Signed)
Will you place sample to slynd at front desk for patient. Thanks, JML

## 2019-06-14 NOTE — Telephone Encounter (Signed)
Looks like she isn't breastfeeding. You may return the [facility] sample to the closet. Thanks, JML

## 2019-06-17 ENCOUNTER — Other Ambulatory Visit: Payer: Self-pay

## 2019-06-17 MED ORDER — DROSPIRENONE-ETHINYL ESTRADIOL 3-0.02 MG PO TABS: 1.0000 | ORAL_TABLET | Freq: Every day | ORAL | 11 refills | Status: DC

## 2019-07-01 ENCOUNTER — Other Ambulatory Visit: Payer: Self-pay

## 2019-07-01 DIAGNOSIS — Z20822 Contact with and (suspected) exposure to covid-19: Secondary | ICD-10-CM

## 2019-07-03 LAB — NOVEL CORONAVIRUS, NAA: SARS-CoV-2, NAA: NOT DETECTED

## 2019-08-20 ENCOUNTER — Other Ambulatory Visit: Payer: Self-pay | Admitting: Certified Nurse Midwife

## 2019-10-15 ENCOUNTER — Encounter: Payer: BLUE CROSS/BLUE SHIELD | Admitting: Certified Nurse Midwife

## 2019-10-18 ENCOUNTER — Encounter: Payer: BLUE CROSS/BLUE SHIELD | Admitting: Certified Nurse Midwife

## 2019-11-12 ENCOUNTER — Other Ambulatory Visit: Payer: Self-pay

## 2019-11-12 DIAGNOSIS — Z20822 Contact with and (suspected) exposure to covid-19: Secondary | ICD-10-CM

## 2019-11-14 LAB — NOVEL CORONAVIRUS, NAA: SARS-CoV-2, NAA: NOT DETECTED

## 2019-11-25 ENCOUNTER — Ambulatory Visit: Payer: PRIVATE HEALTH INSURANCE | Attending: Internal Medicine

## 2019-11-25 DIAGNOSIS — Z20822 Contact with and (suspected) exposure to covid-19: Secondary | ICD-10-CM

## 2019-11-26 ENCOUNTER — Other Ambulatory Visit: Payer: BLUE CROSS/BLUE SHIELD

## 2019-11-27 LAB — NOVEL CORONAVIRUS, NAA: SARS-CoV-2, NAA: NOT DETECTED

## 2019-12-13 ENCOUNTER — Encounter: Payer: Self-pay | Admitting: Certified Nurse Midwife

## 2020-02-14 ENCOUNTER — Encounter: Payer: Self-pay | Admitting: Certified Nurse Midwife

## 2020-03-27 ENCOUNTER — Encounter: Payer: Self-pay | Admitting: Certified Nurse Midwife

## 2020-05-20 ENCOUNTER — Telehealth: Payer: Self-pay

## 2020-05-20 ENCOUNTER — Telehealth: Payer: Self-pay | Admitting: Psychologist

## 2020-05-20 NOTE — Telephone Encounter (Signed)
mychart message sent to patient

## 2020-05-20 NOTE — Telephone Encounter (Signed)
Pt called in with uti symptoms and she stated she has lower back pain. The pt was wanting to be seen this week. I informed the pt that Bethany Gutierrez and Bethany Gutierrez are over 100%, and that I would send a message to them. The pt verbally understood. Please advise

## 2020-06-18 ENCOUNTER — Other Ambulatory Visit: Payer: Self-pay

## 2020-06-18 ENCOUNTER — Ambulatory Visit (INDEPENDENT_AMBULATORY_CARE_PROVIDER_SITE_OTHER): Payer: BC Managed Care – PPO | Admitting: Certified Nurse Midwife

## 2020-06-18 ENCOUNTER — Encounter: Payer: Self-pay | Admitting: Certified Nurse Midwife

## 2020-06-18 VITALS — BP 108/72 | HR 90 | Ht 67.0 in | Wt 180.1 lb

## 2020-06-18 DIAGNOSIS — R5383 Other fatigue: Secondary | ICD-10-CM

## 2020-06-18 DIAGNOSIS — Z01419 Encounter for gynecological examination (general) (routine) without abnormal findings: Secondary | ICD-10-CM | POA: Diagnosis not present

## 2020-06-18 DIAGNOSIS — Z3169 Encounter for other general counseling and advice on procreation: Secondary | ICD-10-CM | POA: Diagnosis not present

## 2020-06-18 NOTE — Patient Instructions (Addendum)
Preparing for Pregnancy If you are considering becoming pregnant, make an appointment to see your regular health care provider to learn how to prepare for a safe and healthy pregnancy (preconception care). During a preconception care visit, your health care provider will:  Do a complete physical exam, including a Pap test.  Take a complete medical history.  Give you information, answer your questions, and help you resolve problems. Preconception checklist Medical history  Tell your health care provider about any current or past medical conditions. Your pregnancy or your ability to become pregnant may be affected by chronic conditions, such as diabetes, chronic hypertension, and thyroid problems.  Include your family's medical history as well as your partner's medical history.  Tell your health care provider about any history of STIs (sexually transmitted infections).These can affect your pregnancy. In some cases, they can be passed to your baby. Discuss any concerns that you have about STIs.  If indicated, discuss the benefits of genetic testing. This testing will show whether there are any genetic conditions that may be passed from you or your partner to your baby.  Tell your health care provider about: ? Any problems you have had with conception or pregnancy. ? Any medicines you take. These include vitamins, herbal supplements, and over-the-counter medicines. ? Your history of immunizations. Discuss any vaccinations that you may need. Diet  Ask your health care provider what to include in a healthy diet that has a balance of nutrients. This is especially important when you are pregnant or preparing to become pregnant.  Ask your health care provider to help you reach a healthy weight before pregnancy. ? If you are overweight, you may be at higher risk for certain complications, such as high blood pressure, diabetes, and preterm birth. ? If you are underweight, you are more likely to  have a baby who has a low birth weight. Lifestyle, work, and home  Let your health care provider know: ? About any lifestyle habits that you have, such as alcohol use, drug use, or smoking. ? About recreational activities that may put you at risk during pregnancy, such as downhill skiing and certain exercise programs. ? Tell your health care provider about any international travel, especially any travel to places with an active [place] virus outbreak. ? About harmful substances that you may be exposed to at work or at home. These include chemicals, pesticides, radiation, or even litter boxes. ? If you do not feel safe at home. Mental health  Tell your health care provider about: ? Any history of mental health conditions, including feelings of depression, sadness, or anxiety. ? Any medicines that you take for a mental health condition. These include herbs and supplements. Home instructions to prepare for pregnancy Lifestyle   Eat a balanced diet. This includes fresh fruits and vegetables, whole grains, lean meats, low-fat dairy products, healthy fats, and foods that are high in fiber. Ask to meet with a nutritionist or registered dietitian for assistance with meal planning and goals.  Get regular exercise. Try to be active for at least 30 minutes a day on most days of the week. Ask your health care provider which activities are safe during pregnancy.  Do not use any products that contain nicotine or tobacco, such as cigarettes and e-cigarettes. If you need help quitting, ask your health care provider.  Do not drink alcohol.  Do not take illegal drugs.  Maintain a healthy weight. Ask your health care provider what weight range is right for you. General  instructions  Keep an accurate record of your menstrual periods. This makes it easier for your health care provider to determine your baby's due date.  Begin taking prenatal vitamins and folic acid supplements daily as directed by your  health care provider.  Manage any chronic conditions, such as high blood pressure and diabetes, as told by your health care provider. This is important. How do I know that I am pregnant? You may be pregnant if you have been sexually active and you miss your period. Symptoms of early pregnancy include:  Mild cramping.  Very light vaginal bleeding (spotting).  Feeling unusually tired.  Nausea and vomiting (morning sickness). If you have any of these symptoms and you suspect that you might be pregnant, you can take a home pregnancy test. These tests check for a hormone in your urine (human chorionic gonadotropin, or hCG). A woman's body begins to make this hormone during early pregnancy. These tests are very accurate. Wait until at least the first day after you miss your period to take one. If the test shows that you are pregnant (you get a positive result), call your health care provider to make an appointment for prenatal care. What should I do if I become pregnant?      Make an appointment with your health care provider as soon as you suspect you are pregnant.  Do not use any products that contain nicotine, such as cigarettes, chewing tobacco, and e-cigarettes. If you need help quitting, ask your health care provider.  Do not drink alcoholic beverages. Alcohol is related to a number of birth defects.  Avoid toxic odors and chemicals.  You may continue to have sexual intercourse if it does not cause pain or other problems, such as vaginal bleeding. This information is not intended to replace advice given to you by your health care provider. Make sure you discuss any questions you have with your health care provider. Document Revised: 11/23/2017 Document Reviewed: 06/12/2016 Elsevier Patient Education  2020 [place] 60-63 Years Old, Female Preventive care refers to visits with your health care provider and lifestyle choices that can promote health and  wellness. This includes:  A yearly physical exam. This may also be called an annual well check.  Regular dental visits and eye exams.  Immunizations.  Screening for certain conditions.  Healthy lifestyle choices, such as eating a healthy diet, getting regular exercise, not using drugs or products that contain nicotine and tobacco, and limiting alcohol use. What can I expect for my preventive care visit? Physical exam Your health care provider will check your:  Height and weight. This may be used to calculate body mass index (BMI), which tells if you are at a healthy weight.  Heart rate and blood pressure.  Skin for abnormal spots. Counseling Your health care provider may ask you questions about your:  Alcohol, tobacco, and drug use.  Emotional well-being.  Home and relationship well-being.  Sexual activity.  Eating habits.  Work and work Statistician.  Method of birth control.  Menstrual cycle.  Pregnancy history. What immunizations do I need?  Influenza (flu) vaccine  This is recommended every year. Tetanus, diphtheria, and pertussis (Tdap) vaccine  You may need a Td booster every 10 years. Varicella (chickenpox) vaccine  You may need this if you have not been vaccinated. Human papillomavirus (HPV) vaccine  If recommended by your health care provider, you may need three doses over 6 months. Measles, mumps, and rubella (MMR) vaccine  You may need at least one dose of MMR. You may also need a second dose. Meningococcal conjugate (MenACWY) vaccine  One dose is recommended if you are age 37-21 years and a first-year college student living in a residence hall, or if you have one of several medical conditions. You may also need additional booster doses. Pneumococcal conjugate (PCV13) vaccine  You may need this if you have certain conditions and were not previously vaccinated. Pneumococcal polysaccharide (PPSV23) vaccine  You may need one or two doses if you  smoke cigarettes or if you have certain conditions. Hepatitis A vaccine  You may need this if you have certain conditions or if you travel or work in places where you may be exposed to hepatitis A. Hepatitis B vaccine  You may need this if you have certain conditions or if you travel or work in places where you may be exposed to hepatitis B. Haemophilus influenzae type b (Hib) vaccine  You may need this if you have certain conditions. You may receive vaccines as individual doses or as more than one vaccine together in one shot (combination vaccines). Talk with your health care provider about the risks and benefits of combination vaccines. What tests do I need?  Blood tests  Lipid and cholesterol levels. These may be checked every 5 years starting at age 38.  Hepatitis C test.  Hepatitis B test. Screening  Diabetes screening. This is done by checking your blood sugar (glucose) after you have not eaten for a while (fasting).  Sexually transmitted disease (STD) testing.  BRCA-related cancer screening. This may be done if you have a family history of breast, ovarian, tubal, or peritoneal cancers.  Pelvic exam and Pap test. This may be done every 3 years starting at age 37. Starting at age 32, this may be done every 5 years if you have a Pap test in combination with an HPV test. Talk with your health care provider about your test results, treatment options, and if necessary, the need for more tests. Follow these instructions at home: Eating and drinking   Eat a diet that includes fresh fruits and vegetables, whole grains, lean protein, and low-fat dairy.  Take vitamin and mineral supplements as recommended by your health care provider.  Do not drink alcohol if: ? Your health care provider tells you not to drink. ? You are pregnant, may be pregnant, or are planning to become pregnant.  If you drink alcohol: ? Limit how much you have to 0-1 drink a day. ? Be aware of how much  alcohol is in your drink. In the [place] one drink equals one 12 oz bottle of beer (355 mL), one 5 oz glass of wine (148 mL), or one 1 oz glass of hard liquor (44 mL). Lifestyle  Take daily care of your teeth and gums.  Stay active. Exercise for at least 30 minutes on 5 or more days each week.  Do not use any products that contain nicotine or tobacco, such as cigarettes, e-cigarettes, and chewing tobacco. If you need help quitting, ask your health care provider.  If you are sexually active, practice safe sex. Use a condom or other form of birth control (contraception) in order to prevent pregnancy and STIs (sexually transmitted infections). If you plan to become pregnant, see your health care provider for a preconception visit. What's next?  Visit your health care provider once a year for a well check visit.  Ask your health care provider how often you should have  your eyes and teeth checked.  Stay up to date on all vaccines. This information is not intended to replace advice given to you by your health care provider. Make sure you discuss any questions you have with your health care provider. Document Revised: 08/02/2018 Document Reviewed: 08/02/2018 Elsevier Patient Education  2020 [employer].

## 2020-06-18 NOTE — Progress Notes (Signed)
ANNUAL PREVENTATIVE CARE GYN  ENCOUNTER NOTE  Subjective:       Bethany Gutierrez is a 29 y.o. G46P1001 female here for a routine annual gynecologic exam.  Current complaints: 1.  Fatigue 2.  Trying to conceive-stopped birth control last month  Denies difficulty breathing or respiratory distress, chest pain, abdominal pain, excessive vaginal bleeding, dysuria, and leg pain or swelling.     Gynecologic History  Patient's last menstrual period was 06/06/2020 (exact date).  Contraception: none  Last Pap: 08/2018. Results were: normal  Obstetric History  OB History  Gravida Para Term Preterm AB Living  1 1 1     1   SAB TAB Ectopic Multiple Live Births        0 1    # Outcome Date GA Lbr Len/2nd Weight Sex Delivery Anes PTL Lv  1 Term 02/22/19 [redacted]w[redacted]d  6 lb 10.2 oz (3.01 kg) F CS-LTranv Spinal  LIV    Past Medical History:  Diagnosis Date   Asthma    Environmental and seasonal allergies    Family history of adverse reaction to anesthesia    mother and sister post op nausea and vomiting   GERD (gastroesophageal reflux disease)    Seasonal allergies     Past Surgical History:  Procedure Laterality Date   CESAREAN SECTION N/A 02/22/2019   Procedure: CESAREAN SECTION;  Surgeon: 02/24/2019, MD;  Location: [facility] ORS;  Service: Obstetrics;  Laterality: N/A;   TONSILLECTOMY      Current Outpatient Medications on File Prior to Visit  Medication Sig Dispense Refill   metoCLOPramide (REGLAN) 10 MG tablet Take 1 tablet (10 mg total) by mouth 4 (four) times daily for 14 days. 56 tablet 0   No current facility-administered medications on file prior to visit.    Allergies  Allergen Reactions   Levofloxacin Other (See Comments)    She states it kept her up all night and gave her a really bad migraine.   Bacitracin Hives   Codeine Nausea And Vomiting    rash   Prednisone Other (See Comments)    Sensative Sensative    Sulfamethoxazole-Trimethoprim Nausea And  Vomiting and Other (See Comments)    Nausea and vomiting Nausea and vomiting    Tramadol Nausea And Vomiting   Vicodin [Hydrocodone-Acetaminophen] Nausea And Vomiting and Rash    Social History   Socioeconomic History   Marital status: Married    Spouse name: Tanner   Number of children: Not on file   Years of education: Not on file   Highest education level: Not on file  Occupational History   Not on file  Tobacco Use   Smoking status: Never Smoker   Smokeless tobacco: Never Used  Vaping Use   Vaping Use: Never used  Substance and Sexual Activity   Alcohol use: No   Drug use: No   Sexual activity: Yes    Birth control/protection: None  Other Topics Concern   Not on file  Social History Narrative   Not on file   Social Determinants of Health   Financial Resource Strain:    Difficulty of Paying Living Expenses:   Food Insecurity:    Worried About Linzie Collin in the Last Year:    Programme researcher, broadcasting/film/video in the Last Year:   Transportation Needs:    Barista (Medical):    Lack of Transportation (Non-Medical):   Physical Activity:    Days of Exercise per Week:  Minutes of Exercise per Session:   Stress:    Feeling of Stress :   Social Connections:    Frequency of Communication with Friends and Family:    Frequency of Social Gatherings with Friends and Family:    Attends Religious Services:    Active Member of Clubs or Organizations:    Attends Engineer, structural:    Marital Status:   Intimate Partner Violence:    Fear of Current or Ex-Partner:    Emotionally Abused:    Physically Abused:    Sexually Abused:     No family history on file.  The following portions of the patient's history were reviewed and updated as appropriate: allergies, current medications, past family history, past medical history, past social history, past surgical history and problem list.  Review of Systems  ROS negative  except as noted above. Information obtained from patient.    Objective:   BP 108/72    Pulse 90    Ht 5\' 7"  (1.702 m)    Wt 180 lb 1 oz (81.7 kg)    LMP 06/06/2020 (Exact Date)    Breastfeeding No    BMI 28.20 kg/m    CONSTITUTIONAL: Well-developed, well-nourished female in no acute distress.   PSYCHIATRIC: Normal mood and affect. Normal behavior. Normal judgment and thought content.  NEUROLGIC: Alert and oriented to person, place, and time. Normal muscle tone coordination. No cranial nerve deficit noted.  HENT:  Normocephalic, atraumatic, External right and left ear normal.  EYES: Conjunctivae and EOM are normal. Pupils are equal and round.   NECK: Normal range of motion, supple, no masses.  Normal thyroid.   SKIN: Skin is warm and dry. No rash noted. Not diaphoretic. No erythema. No pallor.  CARDIOVASCULAR: Normal heart rate noted, regular rhythm, no murmur.  RESPIRATORY: Clear to auscultation bilaterally. Effort and breath sounds normal, no  problems with respiration noted.  BREASTS: Symmetric in size. No masses, skin changes, nipple drainage, or  lymphadenopathy.  ABDOMEN: Soft, normal bowel sounds, no distention noted.  No tenderness, rebound or guarding.   PELVIC:  External Genitalia: Normal  Vagina: Normal  Cervix: Normal  Uterus: Normal  Adnexa: Normal  MUSCULOSKELETAL: Normal range of motion. No tenderness.  No cyanosis, clubbing, or edema.  2+ distal pulses.  LYMPHATIC: No Axillary, Supraclavicular, or Inguinal Adenopathy.  Assessment:   Annual gynecologic examination 29 y.o.   Contraception: none   Overweight   Problem List Items Addressed This Visit    None    Visit Diagnoses    Well woman exam    -  Primary   Relevant Orders   CBC   Comprehensive metabolic panel   TSH   Vitamin B12   Pre-conception counseling       Fatigue, unspecified type       Relevant Orders   CBC   Comprehensive metabolic panel   TSH   Vitamin B12      Plan:    Pap: Not needed and Not done  Labs: See orders  Routine preventative health maintenance measures emphasized: Exercise/Diet/Weight control, Tobacco Warnings, Alcohol/Substance use risks, Stress Management and Peer Pressure Issues; see AVS  Education regarding preparing for pregnancy and vaginal birth after cesarean section; handouts provided  Encouraged prenatal vitamin with folic acid and DHA  Reviewed red flag symptoms and when to call  Return to Clinic - 1 Year for 35 and PAP or sooner if needed   [employer], CNM  [facility], [facility] 06/18/20  8:47 AM

## 2020-06-25 ENCOUNTER — Other Ambulatory Visit: Payer: BC Managed Care – PPO

## 2020-07-02 ENCOUNTER — Other Ambulatory Visit: Payer: BC Managed Care – PPO

## 2020-07-09 ENCOUNTER — Other Ambulatory Visit: Payer: BC Managed Care – PPO

## 2020-07-10 LAB — COMPREHENSIVE METABOLIC PANEL
ALT: 16 IU/L (ref 0–32)
AST: 15 IU/L (ref 0–40)
Albumin/Globulin Ratio: 2.1 (ref 1.2–2.2)
Albumin: 4.7 g/dL (ref 3.9–5.0)
Alkaline Phosphatase: 102 IU/L (ref 48–121)
BUN/Creatinine Ratio: 16 (ref 9–23)
BUN: 10 mg/dL (ref 6–20)
Bilirubin Total: 0.3 mg/dL (ref 0.0–1.2)
CO2: 24 mmol/L (ref 20–29)
Calcium: 9.2 mg/dL (ref 8.7–10.2)
Chloride: 102 mmol/L (ref 96–106)
Creatinine, Ser: 0.64 mg/dL (ref 0.57–1.00)
GFR calc Af Amer: 141 mL/min/{1.73_m2} (ref >59)
GFR calc non Af Amer: 123 mL/min/{1.73_m2} (ref >59)
Globulin, Total: 2.2 g/dL (ref 1.5–4.5)
Glucose: 113 mg/dL — ABNORMAL HIGH (ref 65–99)
Potassium: 4.1 mmol/L (ref 3.5–5.2)
Sodium: 139 mmol/L (ref 134–144)
Total Protein: 6.9 g/dL (ref 6.0–8.5)

## 2020-07-10 LAB — CBC
Hematocrit: 40.7 % (ref 34.0–46.6)
Hemoglobin: 14.1 g/dL (ref 11.1–15.9)
MCH: 30.9 pg (ref 26.6–33.0)
MCHC: 34.6 g/dL (ref 31.5–35.7)
MCV: 89 fL (ref 79–97)
Platelets: 271 10*3/uL (ref 150–450)
RBC: 4.56 x10E6/uL (ref 3.77–5.28)
RDW: 11.4 % — ABNORMAL LOW (ref 11.7–15.4)
WBC: 5.7 10*3/uL (ref 3.4–10.8)

## 2020-07-10 LAB — TSH: TSH: 1.55 u[IU]/mL (ref 0.450–4.500)

## 2020-07-10 LAB — VITAMIN B12: Vitamin B-12: 252 pg/mL (ref 232–1245)

## 2021-01-01 ENCOUNTER — Telehealth: Payer: PRIVATE HEALTH INSURANCE | Admitting: Nurse Practitioner

## 2021-01-01 ENCOUNTER — Other Ambulatory Visit: Payer: Self-pay | Admitting: Certified Nurse Midwife

## 2021-01-01 DIAGNOSIS — U071 COVID-19: Secondary | ICD-10-CM

## 2021-01-01 DIAGNOSIS — R0602 Shortness of breath: Secondary | ICD-10-CM

## 2021-01-01 DIAGNOSIS — Z8709 Personal history of other diseases of the respiratory system: Secondary | ICD-10-CM

## 2021-01-01 MED ORDER — ALBUTEROL SULFATE HFA 108 (90 BASE) MCG/ACT IN AERS: 2.0000 | INHALATION_SPRAY | Freq: Four times a day (QID) | RESPIRATORY_TRACT | 2 refills | Status: DC | PRN

## 2021-01-01 NOTE — Progress Notes (Signed)
Rx Albuterol, see orders.    Serafina Royals, CNM [facility], [facility] 01/01/21 10:00 AM

## 2021-01-01 NOTE — Progress Notes (Signed)
Based on what you shared with me it looks like you reached  out to your PCP and he called in albuterol inhaler for you. If you worsen, let your PCP know.    NOTE: If you entered your credit card information for this eVisit, you will not be charged. You may see a "hold" on your card for the $35 but that hold will drop off and you will not have a charge processed.  If you are having a true medical emergency please call 911.     For an urgent face to face visit, [place] has four urgent care centers for your convenience:   . [facility]    [phone]                  Get Driving Directions  [address] [place], [place] [address] . 10 am to 8 pm Monday-Friday . 12 pm to 8 pm Saturday-Sunday   . [facility] at [facility]  [phone]                  Get Driving Directions  [address] [place], [place] [address] . 8 am to 8 pm Monday-Friday . 9 am to 6 pm Saturday . 11 am to 6 pm Sunday   . [facility] at [facility]  [phone]                  Get Driving Directions   [address] [place], [place] [address] . 8 am to 8 pm Monday-Friday . 8 am to 4 pm Saturday-Sunday    . [facility] at [facility] Directions  [phone]  [address], [address] [place], [place] [address]  . Monday-Friday, 12 PM to 6 PM    Your e-visit answers were reviewed by a board certified advanced clinical practitioner to complete your personal care plan.  Thank you for using e-Visits.

## 2021-01-01 NOTE — Telephone Encounter (Signed)
Patient called in stating that she has COVID and that she noticed that her chest is feeling really heavy. This is concerning for the patient as she has asthma. Patient does not have a primary care and was wanting to see if you would be able to call her in a Rx for her inhaler. Could you please advise? I informed the patient that I was unsure if you would be able to as were not PCP and that I was unsure if that was out of our scope or not. Patient asked for recommendations on where she could go if you were unable to send her in an inhaler and I informed her that I was unsure of that and that you would be able to assist her.

## 2021-01-04 ENCOUNTER — Other Ambulatory Visit: Payer: Self-pay

## 2021-01-14 ENCOUNTER — Encounter: Payer: Self-pay | Admitting: Certified Nurse Midwife

## 2021-06-21 ENCOUNTER — Encounter: Payer: BC Managed Care – PPO | Admitting: Certified Nurse Midwife

## 2021-07-01 NOTE — Patient Instructions (Addendum)
Common Medications Safe in Pregnancy  Acne:      Constipation:  Benzoyl Peroxide     Colace  Clindamycin      Dulcolax Suppository  Topica Erythromycin     Fibercon  Salicylic Acid      Metamucil         Miralax AVOID:        Senakot   Accutane    Cough:  Retin-A       Cough Drops  Tetracycline      Phenergan w/ Codeine if Rx  Minocycline      Robitussin (Plain & DM)  Antibiotics:     Crabs/Lice:  Ceclor       RID  Cephalosporins    AVOID:  E-Mycins      Kwell  Keflex  Macrobid/Macrodantin   Diarrhea:  Penicillin      Kao-Pectate  Zithromax      Imodium AD         PUSH FLUIDS AVOID:       Cipro     Fever:  Tetracycline      Tylenol (Regular or Extra  Minocycline       Strength)  Levaquin      Extra Strength-Do not          Exceed 8 tabs/24 hrs Caffeine:        <200mg/day (equiv. To 1 cup of coffee or  approx. 3 12 oz sodas)         Gas: Cold/Hayfever:       Gas-X  Benadryl      Mylicon  Claritin       Phazyme  **Claritin-D        Chlor-Trimeton    Headaches:  Dimetapp      ASA-Free Excedrin  Drixoral-Non-Drowsy     Cold Compress  Mucinex (Guaifenasin)     Tylenol (Regular or Extra  Sudafed/Sudafed-12 Hour     Strength)  **Sudafed PE Pseudoephedrine   Tylenol Cold & Sinus     Vicks Vapor Rub  Zyrtec  **AVOID if Problems With Blood Pressure         Heartburn: Avoid lying down for at least 1 hour after meals  Aciphex      Maalox     Rash:  Milk of Magnesia     Benadryl    Mylanta       1% Hydrocortisone Cream  Pepcid  Pepcid Complete   Sleep Aids:  Prevacid      Ambien   Prilosec       Benadryl  Rolaids       Chamomile Tea  Tums (Limit 4/day)     Unisom         Tylenol PM         Warm milk-add vanilla or  Hemorrhoids:       Sugar for taste  Anusol/Anusol H.C.  (RX: Analapram 2.5%)  Sugar Substitutes:  Hydrocortisone OTC     Ok in moderation  Preparation H      Tucks        Vaseline lotion applied to tissue with  wiping    Herpes:     Throat:  Acyclovir      Oragel  Famvir  Valtrex     Vaccines:         Flu Shot Leg Cramps:       *Gardasil  Benadryl      Hepatitis A         Hepatitis B Nasal Spray:         Pneumovax  Saline Nasal Spray     Polio Booster         Tetanus Nausea:       Tuberculosis test or PPD  Vitamin B6 25 mg TID   AVOID:    Dramamine      *Gardasil  Emetrol       Live Poliovirus  Ginger Root 250 mg QID    MMR (measles, mumps &  High Complex Carbs @ Bedtime    rebella)  Sea Bands-Accupressure    Varicella (Chickenpox)  Unisom 1/2 tab TID     *No known complications           If received before Pain:         Known pregnancy;   Darvocet       Resume series after  Lortab        Delivery  Percocet    Yeast:   Tramadol      Femstat  Tylenol 3      Gyne-lotrimin  Ultram       Monistat  Vicodin           MISC:         All Sunscreens           Hair Coloring/highlights          Insect Repellant's          (Including DEET)         Mystic Tans   [email or web address]">  First Trimester of Pregnancy  The first trimester of pregnancy starts on the first day of your last menstrual period until the end of week 12. This is also called months 1 through 3 ofpregnancy. Body changes during your first trimester Your body goes through many changes during pregnancy. The changes usuallyreturn to normal after your baby is born. Physical changes You may gain or lose weight. Your breasts may grow larger and hurt. The area around your nipples may get darker. Dark spots or blotches may develop on your face. You may have changes in your hair. Health changes You may feel like you might vomit (nauseous), and you may vomit. You may have heartburn. You may have headaches. You may have trouble pooping (constipation). Your gums may bleed. Other changes You may get tired easily. You may pee (urinate) more often. Your menstrual periods will stop. You may not feel  hungry. You may want to eat certain kinds of food. You may have changes in your emotions from day to day. You may have more dreams. Follow these instructions at home: Medicines Take over-the-counter and prescription medicines only as told by your doctor. Some medicines are not safe during pregnancy. Take a prenatal vitamin that contains at least 600 micrograms (mcg) of folic acid. Eating and drinking Eat healthy meals that include: Fresh fruits and vegetables. Whole grains. Good sources of protein, such as meat, eggs, or tofu. Low-fat dairy products. Avoid raw meat and unpasteurized juice, milk, and cheese. If you feel like you may vomit, or you vomit: Eat 4 or 5 small meals a day instead of 3 large meals. Try eating a few soda crackers. Drink liquids between meals instead of during meals. You may need to take these actions to prevent or treat trouble pooping: Drink enough fluids to keep your pee (urine) pale yellow. Eat foods that are high in fiber. These include beans, whole grains, and fresh fruits and vegetables. Limit foods that are high in fat and sugar. These include fried or sweet foods. Activity Exercise  only as told by your doctor. Most people can do their usual exercise routine during pregnancy. Stop exercising if you have cramps or pain in your lower belly (abdomen) or low back. Do not exercise if it is too hot or too humid, or if you are in a place of great height (high altitude). Avoid heavy lifting. If you choose to, you may have sex unless your doctor tells you not to. Relieving pain and discomfort Wear a good support bra if your breasts are sore. Rest with your legs raised (elevated) if you have leg cramps or low back pain. If you have bulging veins (varicose veins) in your legs: Wear support hose as told by your doctor. Raise your feet for 15 minutes, 3-4 times a day. Limit salt in your food. Safety Wear your seat belt at all times when you are in a car. Talk  with your doctor if someone is hurting you or yelling at you. Talk with your doctor if you are feeling sad or have thoughts of hurting yourself. Lifestyle Do not use hot tubs, steam rooms, or saunas. Do not douche. Do not use tampons or scented sanitary pads. Do not use herbal medicines, illegal drugs, or medicines that are not approved by your doctor. Do not drink alcohol. Do not smoke or use any products that contain nicotine or tobacco. If you need help quitting, ask your doctor. Avoid cat litter boxes and soil that is used by cats. These carry germs that can cause harm to the baby and can cause a loss of your baby by miscarriage or stillbirth. General instructions Keep all follow-up visits. This is important. Ask for help if you need counseling or if you need help with nutrition. Your doctor can give you advice or tell you where to go for help. Visit your dentist. At home, brush your teeth with a soft toothbrush. Floss gently. Write down your questions. Take them to your prenatal visits. Where to find more information American Pregnancy Association: americanpregnancy.org [employer]: www.acog.org Office on Women's Health: KeywordPortfolios.com.br Contact a doctor if: You are dizzy. You have a fever. You have mild cramps or pressure in your lower belly. You have a nagging pain in your belly area. You continue to feel like you may vomit, you vomit, or you have watery poop (diarrhea) for 24 hours or longer. You have a bad-smelling fluid coming from your vagina. You have pain when you pee. You are exposed to a disease that spreads from person to person, such as chickenpox, measles, Zika virus, HIV, or hepatitis. Get help right away if: You have spotting or bleeding from your vagina. You have very bad belly cramping or pain. You have shortness of breath or chest pain. You have any kind of injury, such as from a fall or a car crash. You have new  or increased pain, swelling, or redness in an arm or leg. Summary The first trimester of pregnancy starts on the first day of your last menstrual period until the end of week 12 (months 1 through 3). Eat 4 or 5 small meals a day instead of 3 large meals. Do not smoke or use any products that contain nicotine or tobacco. If you need help quitting, ask your doctor. Keep all follow-up visits. This information is not intended to replace advice given to you by your health care provider. Make sure you discuss any questions you have with your healthcare provider. Document Revised: 04/29/2020 Document Reviewed: 03/05/2020 Elsevier Patient Education  [place].

## 2021-07-01 NOTE — Progress Notes (Deleted)
GYN ENCOUNTER NOTE  Subjective:       Bethany Gutierrez is a 30 y.o. G38P1001 female is here for gynecologic evaluation of the following issues:  1. ***.     Gynecologic History Patient's last menstrual period was 05/12/2021 (approximate). Contraception: {method:5051} Last Pap: ***. Results were: {norm/abn:16337} Last mammogram: ***. Results were: {norm/abn:16337}  Obstetric History OB History  Gravida Para Term Preterm AB Living  1 1 1     1   SAB IAB Ectopic Multiple Live Births        0 1    # Outcome Date GA Lbr Len/2nd Weight Sex Delivery Anes PTL Lv  1 Term 02/22/19 [redacted]w[redacted]d  6 lb 10.2 oz (3.01 kg) F CS-LTranv Spinal  LIV    Past Medical History:  Diagnosis Date   Asthma    Environmental and seasonal allergies    Family history of adverse reaction to anesthesia    mother and sister post op nausea and vomiting   GERD (gastroesophageal reflux disease)    Seasonal allergies     Past Surgical History:  Procedure Laterality Date   CESAREAN SECTION N/A 02/22/2019   Procedure: CESAREAN SECTION;  Surgeon: 02/24/2019, MD;  Location: [facility] ORS;  Service: Obstetrics;  Laterality: N/A;   TONSILLECTOMY      No current outpatient medications on file prior to visit.   No current facility-administered medications on file prior to visit.    Allergies  Allergen Reactions   Levofloxacin Other (See Comments)    She states it kept her up all night and gave her a really bad migraine.   Bacitracin Hives   Codeine Nausea And Vomiting    rash   Prednisone Other (See Comments)    Sensative Sensative    Sulfamethoxazole-Trimethoprim Nausea And Vomiting and Other (See Comments)    Nausea and vomiting Nausea and vomiting    Tramadol Nausea And Vomiting   Vicodin [Hydrocodone-Acetaminophen] Nausea And Vomiting and Rash    Social History   Socioeconomic History   Marital status: Married    Spouse name: Tanner   Number of children: Not on file   Years of education: Not on  file   Highest education level: Not on file  Occupational History   Not on file  Tobacco Use   Smoking status: Never   Smokeless tobacco: Never  Vaping Use   Vaping Use: Never used  Substance and Sexual Activity   Alcohol use: No   Drug use: No   Sexual activity: Yes    Birth control/protection: None  Other Topics Concern   Not on file  Social History Narrative   Not on file   Social Determinants of Health   Financial Resource Strain: Not on file  Food Insecurity: Not on file  Transportation Needs: Not on file  Physical Activity: Not on file  Stress: Not on file  Social Connections: Not on file  Intimate Partner Violence: Not on file    History reviewed. No pertinent family history.  The following portions of the patient's history were reviewed and updated as appropriate: allergies, current medications, past family history, past medical history, past social history, past surgical history and problem list.  Review of Systems Review of Systems - {ros master:310782} Review of Systems - General ROS: negative for - chills, fatigue, fever, hot flashes, malaise or night sweats Hematological and Lymphatic ROS: negative for - bleeding problems or swollen lymph nodes Gastrointestinal ROS: negative for - abdominal pain, blood in stools, change in  bowel habits and nausea/vomiting Musculoskeletal ROS: negative for - joint pain, muscle pain or muscular weakness Genito-Urinary ROS: negative for - change in menstrual cycle, dysmenorrhea, dyspareunia, dysuria, genital discharge, genital ulcers, hematuria, incontinence, irregular/heavy menses, nocturia or pelvic painjj  Objective:   BP 117/79   Pulse 85   Resp 16   Ht 5\' 7"  (1.702 m)   Wt 186 lb 14.4 oz (84.8 kg)   LMP 05/12/2021 (Approximate)   BMI 29.27 kg/m  CONSTITUTIONAL: Well-developed, well-nourished female in no acute distress.  HENT:  Normocephalic, atraumatic.  NECK: Normal range of motion, supple, no masses.  Normal  thyroid.  SKIN: Skin is warm and dry. No rash noted. Not diaphoretic. No erythema. No pallor. NEUROLGIC: Alert and oriented to person, place, and time. PSYCHIATRIC: Normal mood and affect. Normal behavior. Normal judgment and thought content. CARDIOVASCULAR:Not Examined RESPIRATORY: Not Examined BREASTS: Not Examined ABDOMEN: Soft, non distended; Non tender.  No Organomegaly. PELVIC:  External Genitalia: Normal  BUS: Normal  Vagina: Normal  Cervix: Normal  Uterus: Normal size, shape,consistency, mobile  Adnexa: Normal  RV: Normal   Bladder: Nontender MUSCULOSKELETAL: Normal range of motion. No tenderness.  No cyanosis, clubbing, or edema.     Assessment:   1. Pregnancy confirmed by positive urine test *** - POCT urine pregnancy - 07/12/2021 OB LESS THAN 14 WEEKS WITH OB TRANSVAGINAL; Future  2. Pregnancy with uncertain fetal viability, single or unspecified fetus *** - US OB LESS THAN 14 WEEKS WITH OB TRANSVAGINAL; Future     Plan:   ***

## 2021-07-02 ENCOUNTER — Encounter: Payer: Self-pay | Admitting: Certified Nurse Midwife

## 2021-07-02 ENCOUNTER — Other Ambulatory Visit: Payer: Self-pay

## 2021-07-02 ENCOUNTER — Ambulatory Visit (INDEPENDENT_AMBULATORY_CARE_PROVIDER_SITE_OTHER): Payer: BC Managed Care – PPO | Admitting: Certified Nurse Midwife

## 2021-07-02 VITALS — BP 117/79 | HR 85 | Resp 16 | Ht 67.0 in | Wt 186.9 lb

## 2021-07-02 DIAGNOSIS — Z3201 Encounter for pregnancy test, result positive: Secondary | ICD-10-CM

## 2021-07-02 DIAGNOSIS — O3680X0 Pregnancy with inconclusive fetal viability, not applicable or unspecified: Secondary | ICD-10-CM

## 2021-07-02 LAB — POCT URINE PREGNANCY: Preg Test, Ur: POSITIVE — AB

## 2021-07-02 NOTE — Progress Notes (Signed)
GYN ENCOUNTER NOTE  Subjective:       Bethany Gutierrez is a 30 y.o. G32P1001 female here for pregnancy confirmation.   Reports positive home pregnancy test Friday.   Endorses fatigue.   Denies difficulty breathing or respiratory distress, chest pain, abdominal pain, vaginal bleeding, dysuria, and leg pain or swelling.    Gynecologic History  Patient's last menstrual period was 05/12/2021 (approximate).  Gestational age: 51 weeks 2 days  Estimated date of birth: 02/16/2021  Contraception: none  Last Pap: due 08/20/2021.   Obstetric History  OB History  Gravida Para Term Preterm AB Living  2 1 1     1   SAB IAB Ectopic Multiple Live Births        0 1    # Outcome Date GA Lbr Len/2nd Weight Sex Delivery Anes PTL Lv  2 Current           1 Term 02/22/19 [redacted]w[redacted]d  6 lb 10.2 oz (3.01 kg) F CS-LTranv Spinal  LIV    Past Medical History:  Diagnosis Date   Asthma    Environmental and seasonal allergies    Family history of adverse reaction to anesthesia    mother and sister post op nausea and vomiting   GERD (gastroesophageal reflux disease)    Seasonal allergies     Past Surgical History:  Procedure Laterality Date   CESAREAN SECTION N/A 02/22/2019   Procedure: CESAREAN SECTION;  Surgeon: 02/24/2019, MD;  Location: [facility] ORS;  Service: Obstetrics;  Laterality: N/A;   TONSILLECTOMY      No current outpatient medications on file prior to visit.   No current facility-administered medications on file prior to visit.    Allergies  Allergen Reactions   Levofloxacin Other (See Comments)    She states it kept her up all night and gave her a really bad migraine.   Bacitracin Hives   Codeine Nausea And Vomiting    rash   Prednisone Other (See Comments)    Sensative Sensative    Sulfamethoxazole-Trimethoprim Nausea And Vomiting and Other (See Comments)    Nausea and vomiting Nausea and vomiting    Tramadol Nausea And Vomiting   Vicodin [Hydrocodone-Acetaminophen]  Nausea And Vomiting and Rash    Social History   Socioeconomic History   Marital status: Married    Spouse name: Tanner   Number of children: Not on file   Years of education: Not on file   Highest education level: Not on file  Occupational History   Not on file  Tobacco Use   Smoking status: Never   Smokeless tobacco: Never  Vaping Use   Vaping Use: Never used  Substance and Sexual Activity   Alcohol use: No   Drug use: No   Sexual activity: Yes    Birth control/protection: None  Other Topics Concern   Not on file  Social History Narrative   Not on file   Social Determinants of Health   Financial Resource Strain: Not on file  Food Insecurity: Not on file  Transportation Needs: Not on file  Physical Activity: Not on file  Stress: Not on file  Social Connections: Not on file  Intimate Partner Violence: Not on file    History reviewed. No pertinent family history.  The following portions of the patient's history were reviewed and updated as appropriate: allergies, current medications, past family history, past medical history, past social history, past surgical history and problem list.  Review of Systems  ROS negative except  as noted above. Information obtained from patient.   Objective:   BP 117/79   Pulse 85   Resp 16   Ht 5\' 7"  (1.702 m)   Wt 186 lb 14.4 oz (84.8 kg)   LMP 05/12/2021 (Approximate)   BMI 29.27 kg/m   CONSTITUTIONAL: Well-developed, well-nourished female in no acute distress.   PHYSICAL EXAM: Not indicated.    Recent Results (from the past 2160 hour(s))  POCT urine pregnancy     Status: Abnormal   Collection Time: 07/02/21  9:15 AM  Result Value Ref Range   Preg Test, Ur Positive (A) Negative     Assessment:   1. Pregnancy confirmed by positive urine test  - POCT urine pregnancy - 07/04/21 OB LESS THAN 14 WEEKS WITH OB TRANSVAGINAL; Future  2. Pregnancy with uncertain fetal viability, single or unspecified fetus  - US OB LESS  THAN 14 WEEKS WITH OB TRANSVAGINAL; Future     Plan:   First trimester education, see AVS.   Ultrasound ordered, see chart.   Reviewed red flag symptoms and when to call.   RTC x 2-3 weeks for intake; RTC x 5-6 weeks for NOB PE or sooner if needed.     [place], CNM [facility], [facility] 07/02/21 10:48 AM

## 2021-07-16 ENCOUNTER — Other Ambulatory Visit: Payer: Self-pay

## 2021-07-16 ENCOUNTER — Ambulatory Visit (INDEPENDENT_AMBULATORY_CARE_PROVIDER_SITE_OTHER): Payer: BC Managed Care – PPO | Admitting: Certified Nurse Midwife

## 2021-07-16 VITALS — BP 114/75 | HR 89 | Resp 16 | Ht 67.0 in | Wt 188.1 lb

## 2021-07-16 DIAGNOSIS — Z3401 Encounter for supervision of normal first pregnancy, first trimester: Secondary | ICD-10-CM

## 2021-07-16 DIAGNOSIS — Z3A09 9 weeks gestation of pregnancy: Secondary | ICD-10-CM | POA: Diagnosis not present

## 2021-07-16 DIAGNOSIS — Z113 Encounter for screening for infections with a predominantly sexual mode of transmission: Secondary | ICD-10-CM | POA: Diagnosis not present

## 2021-07-16 LAB — OB RESULTS CONSOLE VARICELLA ZOSTER ANTIBODY, IGG: Varicella: IMMUNE

## 2021-07-16 NOTE — Patient Instructions (Signed)
First Trimester of Pregnancy  The first trimester of pregnancy starts on the first day of your last menstrual period until the end of week 12. This is also called months 1 through 3 ofpregnancy. Body changes during your first trimester Your body goes through many changes during pregnancy. The changes usuallyreturn to normal after your baby is born. Physical changes You may gain or lose weight. Your breasts may grow larger and hurt. The area around your nipples may get darker. Dark spots or blotches may develop on your face. You may have changes in your hair. Health changes You may feel like you might vomit (nauseous), and you may vomit. You may have heartburn. You may have headaches. You may have trouble pooping (constipation). Your gums may bleed. Other changes You may get tired easily. You may pee (urinate) more often. Your menstrual periods will stop. You may not feel hungry. You may want to eat certain kinds of food. You may have changes in your emotions from day to day. You may have more dreams. Follow these instructions at home: Medicines Take over-the-counter and prescription medicines only as told by your doctor. Some medicines are not safe during pregnancy. Take a prenatal vitamin that contains at least 600 micrograms (mcg) of folic acid. Eating and drinking Eat healthy meals that include: Fresh fruits and vegetables. Whole grains. Good sources of protein, such as meat, eggs, or tofu. Low-fat dairy products. Avoid raw meat and unpasteurized juice, milk, and cheese. If you feel like you may vomit, or you vomit: Eat 4 or 5 small meals a day instead of 3 large meals. Try eating a few soda crackers. Drink liquids between meals instead of during meals. You may need to take these actions to prevent or treat trouble pooping: Drink enough fluids to keep your pee (urine) pale yellow. Eat foods that are high in fiber. These include beans, whole grains, and fresh fruits and  vegetables. Limit foods that are high in fat and sugar. These include fried or sweet foods. Activity Exercise only as told by your doctor. Most people can do their usual exercise routine during pregnancy. Stop exercising if you have cramps or pain in your lower belly (abdomen) or low back. Do not exercise if it is too hot or too humid, or if you are in a place of great height (high altitude). Avoid heavy lifting. If you choose to, you may have sex unless your doctor tells you not to. Relieving pain and discomfort Wear a good support bra if your breasts are sore. Rest with your legs raised (elevated) if you have leg cramps or low back pain. If you have bulging veins (varicose veins) in your legs: Wear support hose as told by your doctor. Raise your feet for 15 minutes, 3-4 times a day. Limit salt in your food. Safety Wear your seat belt at all times when you are in a car. Talk with your doctor if someone is hurting you or yelling at you. Talk with your doctor if you are feeling sad or have thoughts of hurting yourself. Lifestyle Do not use hot tubs, steam rooms, or saunas. Do not douche. Do not use tampons or scented sanitary pads. Do not use herbal medicines, illegal drugs, or medicines that are not approved by your doctor. Do not drink alcohol. Do not smoke or use any products that contain nicotine or tobacco. If you need help quitting, ask your doctor. Avoid cat litter boxes and soil that is used by cats. These carry germs that  can cause harm to the baby and can cause a loss of your baby by miscarriage or stillbirth. General instructions Keep all follow-up visits. This is important. Ask for help if you need counseling or if you need help with nutrition. Your doctor can give you advice or tell you where to go for help. Visit your dentist. At home, brush your teeth with a soft toothbrush. Floss gently. Write down your questions. Take them to your prenatal visits. Where to find more  information American Pregnancy Association: americanpregnancy.org [employer]: www.acog.org Office on Women's Health: MightyReward.co.nz Contact a doctor if: You are dizzy. You have a fever. You have mild cramps or pressure in your lower belly. You have a nagging pain in your belly area. You continue to feel like you may vomit, you vomit, or you have watery poop (diarrhea) for 24 hours or longer. You have a bad-smelling fluid coming from your vagina. You have pain when you pee. You are exposed to a disease that spreads from person to person, such as chickenpox, measles, Zika virus, HIV, or hepatitis. Get help right away if: You have spotting or bleeding from your vagina. You have very bad belly cramping or pain. You have shortness of breath or chest pain. You have any kind of injury, such as from a fall or a car crash. You have new or increased pain, swelling, or redness in an arm or leg. Summary The first trimester of pregnancy starts on the first day of your last menstrual period until the end of week 12 (months 1 through 3). Eat 4 or 5 small meals a day instead of 3 large meals. Do not smoke or use any products that contain nicotine or tobacco. If you need help quitting, ask your doctor. Keep all follow-up visits. This information is not intended to replace advice given to you by your health care provider. Make sure you discuss any questions you have with your healthcare provider. Document Revised: 04/29/2020 Document Reviewed: 03/05/2020 Elsevier Patient Education  2022 Elsevier Inc.  Commonly Asked Questions During Pregnancy  Cats: A parasite can be excreted in cat feces.  To avoid exposure you need to have another person empty the little box.  If you must empty the litter box you will need to wear gloves.  Wash your hands after handling your cat.  This parasite can also be found in raw or undercooked meat so this should also be  avoided.  Colds, Sore Throats, Flu: Please check your medication sheet to see what you can take for symptoms.  If your symptoms are unrelieved by these medications please call the office.  Dental Work: Most any dental work Agricultural consultant recommends is permitted.  X-rays should only be taken during the first trimester if absolutely necessary.  Your abdomen should be shielded with a lead apron during all x-rays.  Please notify your provider prior to receiving any x-rays.  Novocaine is fine; gas is not recommended.  If your dentist requires a note from [place] prior to dental work please call the office and we will provide one for you.  Exercise: Exercise is an important part of staying healthy during your pregnancy.  You may continue most exercises you were accustomed to prior to pregnancy.  Later in your pregnancy you will most likely notice you have difficulty with activities requiring balance like riding a bicycle.  It is important that you listen to your body and avoid activities that put you at a higher risk of  falling.  Adequate rest and staying well hydrated are a must!  If you have questions about the safety of specific activities ask your provider.    Exposure to Children with illness: Try to avoid obvious exposure; report any symptoms to [place] when noted,  If you have chicken pos, red measles or mumps, you should be immune to these diseases.   Please do not take any vaccines while pregnant unless you have checked with your OB provider.  Fetal Movement: After 28 weeks we recommend you do "kick counts" twice daily.  Lie or sit down in a calm quiet environment and count your baby movements "kicks".  You should feel your baby at least 10 times per hour.  If you have not felt 10 kicks within the first hour get up, walk around and have something sweet to eat or drink then repeat for an additional hour.  If count remains less than 10 per hour notify your provider.  Fumigating: Follow your pest control agent's  advice as to how long to stay out of your home.  Ventilate the area well before re-entering.  Hemorrhoids:   Most over-the-counter preparations can be used during pregnancy.  Check your medication to see what is safe to use.  It is important to use a stool softener or fiber in your diet and to drink lots of liquids.  If hemorrhoids seem to be getting worse please call the office.   Hot Tubs:  Hot tubs Jacuzzis and saunas are not recommended while pregnant.  These increase your internal body temperature and should be avoided.  Intercourse:  Sexual intercourse is safe during pregnancy as long as you are comfortable, unless otherwise advised by your provider.  Spotting may occur after intercourse; report any bright red bleeding that is heavier than spotting.  Labor:  If you know that you are in labor, please go to the hospital.  If you are unsure, please call the office and let us help you decide what to do.  Lifting, straining, etc:  If your job requires heavy lifting or straining please check with your provider for any limitations.  Generally, you should not lift items heavier than that you can lift simply with your hands and arms (no back muscles)  Painting:  Paint fumes do not harm your pregnancy, but may make you ill and should be avoided if possible.  Latex or water based paints have less odor than oils.  Use adequate ventilation while painting.  Permanents & Hair Color:  Chemicals in hair dyes are not recommended as they cause increase hair dryness which can increase hair loss during pregnancy.  " Highlighting" and permanents are allowed.  Dye may be absorbed differently and permanents may not hold as well during pregnancy.  Sunbathing:  Use a sunscreen, as skin burns easily during pregnancy.  Drink plenty of fluids; avoid over heating.  Tanning Beds:  Because their possible side effects are still unknown, tanning beds are not recommended.  Ultrasound Scans:  Routine ultrasounds are performed  at approximately 20 weeks.  You will be able to see your baby's general anatomy an if you would like to know the gender this can usually be determined as well.  If it is questionable when you conceived you may also receive an ultrasound early in your pregnancy for dating purposes.  Otherwise ultrasound exams are not routinely performed unless there is a medical necessity.  Although you can request a scan we ask that you pay for it when conducted because  insurance does not cover " patient request" scans.  Work: If your pregnancy proceeds without complications you may work until your due date, unless your physician or employer advises otherwise.  Round Ligament Pain/Pelvic Discomfort:  Sharp, shooting pains not associated with bleeding are fairly common, usually occurring in the second trimester of pregnancy.  They tend to be worse when standing up or when you remain standing for long periods of time.  These are the result of pressure of certain pelvic ligaments called "round ligaments".  Rest, Tylenol and heat seem to be the most effective relief.  As the womb and fetus grow, they rise out of the pelvis and the discomfort improves.  Please notify the office if your pain seems different than that described.  It may represent a more serious condition.  Morning Sickness  Morning sickness is when you feel like you may vomit (feel nauseous) during pregnancy. Sometimes, you may vomit. Morning sickness most often happens in the morning, but it can also happen at any time of the day. Some women may have morning sickness that makes them vomit all the time. This is amore serious problem that needs treatment. What are the causes? The cause of this condition is not known. What increases the risk? You had vomiting or a feeling like you may vomit before your pregnancy. You had morning sickness in another pregnancy. You are pregnant with more than one baby, such as twins. What are the signs or symptoms? Feeling like  you may vomit. Vomiting. How is this treated? Treatment is usually not needed for this condition. You may only need to change what you eat. In some cases, your doctor may give you some things to take for your condition. These include: Vitamin B6 supplements. Medicines to treat the feeling that you may vomit. Ginger. Follow these instructions at home: Medicines Take over-the-counter and prescription medicines only as told by your doctor. Do not take any medicines until you talk with your doctor about them first. Take multivitamins before you get pregnant. These can stop or lessen the symptoms of morning sickness. Eating and drinking Eat dry toast or crackers before getting out of bed. Eat 5 or 6 small meals a day. Eat dry and bland foods like rice and baked potatoes. Do not eat greasy, fatty, or spicy foods. Have someone cook for you if the smell of food causes you to vomit or to feel like you may vomit. If you feel like you may vomit after taking prenatal vitamins, take them at night or with a snack. Eat protein foods when you need a snack. Nuts, yogurt, and cheese are good choices. Drink fluids throughout the day. Try ginger ale made with real ginger, ginger tea made from fresh grated ginger, or ginger candies. General instructions Do not smoke or use any products that contain nicotine or tobacco. If you need help quitting, ask your doctor. Use an air purifier to keep the air in your house free of smells. Get lots of fresh air. Try to avoid smells that make you feel sick. Try wearing an acupressure wristband. This is a wristband that is used to treat seasickness. Try a treatment called acupuncture. In this treatment, a doctor puts needles into certain areas of your body to make you feel better. Contact a doctor if: You need medicine to feel better. You feel dizzy or light-headed. You are losing weight. Get help right away if: The feeling that you may vomit will not go away, or you  cannot stop  vomiting. You faint. You have very bad pain in your belly. Summary Morning sickness is when you feel like you may vomit (feel nauseous) during pregnancy. You may feel sick in the morning, but you can feel this way at any time of the day. Making some changes to what you eat may help your symptoms go away. This information is not intended to replace advice given to you by your health care provider. Make sure you discuss any questions you have with your healthcare provider. Document Revised: 07/06/2020 Document Reviewed: 06/15/2020 Elsevier Patient Education  [address]  [place] Class  May 17, 2017  Wednesday 7:00p - 9:00p  [facility] [place], [place]  June 21, 2017  Wednesday 7:00p - 9:00p [facility] [place], [place]    July 26, 2017   Wednesday 7:00p - 9:000p [facility] [place], [place]  August 23, 2017  Wednesday  7:00p - 9:00p [facility] [place], [place]  September 20, 2017 Wednesday 7:00p - 9:00p [facility] [place], [place]  Interested in a waterbirth?  This informational class will help you discover whether waterbirth is the right fit for you.  Education about waterbirth itself, supplies you would need and how to assemble your support team is what you can expect from this class.  Some obstetrical practices require this class in order to pursue a waterbirth.  (Not all obstetrical practices offer waterbirth check with your healthcare provider)  Register only the expectant mom, but you are encouraged to bring your partner to class!  Fees & Payment No fee  Register Online [email or web address] Search Waterbirth   [email or web address]    [facility]  [address] [place], [place], [place] [address]  Phone: [phone]  [facility] (second location)  [address] [place], [place] [address]  Phone: [phone]  [facility]) [address] [place], [place], [place] [address] Phone: [phone]  [facility]  [address], [place], [place] [address]  Phone: [phone]

## 2021-07-16 NOTE — Progress Notes (Signed)
[name] presents for NOB nurse interview visit. Pregnancy confirmation done 07/02/2021.  [id number]. Pregnancy education material explained and given. 0 cats in the home. NOB labs ordered. HIV labs and Drug screen were explained optional and she did decline. Drug screen declined. PNV encouraged she has been taking PNV and does not need a rx at this time. Genetic screening options discussed. Genetic testing: Ordered. Please place results in envelope and call patient to pick up.  Pt may discuss with provider.  Financial policy reviewed. FMLA form reviewed and signed. Pt. to follow up with provider in 3 weeks for NOB physical.  All questions answered.

## 2021-07-17 LAB — URINALYSIS, ROUTINE W REFLEX MICROSCOPIC
Bilirubin, UA: NEGATIVE
Glucose, UA: NEGATIVE
Ketones, UA: NEGATIVE
Leukocytes,UA: NEGATIVE
Nitrite, UA: NEGATIVE
Protein,UA: NEGATIVE
RBC, UA: NEGATIVE
Specific Gravity, UA: 1.022 (ref 1.005–1.030)
Urobilinogen, Ur: 0.2 mg/dL (ref 0.2–1.0)
pH, UA: 6.5 (ref 5.0–7.5)

## 2021-07-18 LAB — URINE CULTURE, OB REFLEX

## 2021-07-18 LAB — CULTURE, OB URINE

## 2021-07-19 LAB — PARVOVIRUS B19 ANTIBODY, IGG AND IGM
Parvovirus B19 IgG: 5.6 index — ABNORMAL HIGH (ref 0.0–0.8)
Parvovirus B19 IgM: 0.3 index (ref 0.0–0.8)

## 2021-07-19 LAB — RUBELLA SCREEN: Rubella Antibodies, IGG: 2.63 index (ref >0.99)

## 2021-07-19 LAB — ANTIBODY SCREEN: Antibody Screen: NEGATIVE

## 2021-07-19 LAB — VIRAL HEPATITIS HBV, HCV
HCV Ab: <0.1 s/co ratio (ref 0.0–0.9)
Hep B Core Total Ab: NEGATIVE
Hep B Surface Ab, Qual: REACTIVE
Hepatitis B Surface Ag: NEGATIVE

## 2021-07-19 LAB — ABO AND RH: Rh Factor: POSITIVE

## 2021-07-19 LAB — VARICELLA ZOSTER ANTIBODY, IGG: Varicella zoster IgG: 352 index (ref >165)

## 2021-07-19 LAB — RPR: RPR Ser Ql: NONREACTIVE

## 2021-07-19 LAB — HCV INTERPRETATION

## 2021-07-20 LAB — GC/CHLAMYDIA PROBE AMP
Chlamydia trachomatis, NAA: NEGATIVE
Neisseria Gonorrhoeae by PCR: NEGATIVE

## 2021-07-23 ENCOUNTER — Other Ambulatory Visit: Payer: Self-pay

## 2021-07-23 ENCOUNTER — Ambulatory Visit
Admission: RE | Admit: 2021-07-23 | Discharge: 2021-07-23 | Disposition: A | Discharge status: Home / Self Care | Payer: BC Managed Care – PPO | Source: Ambulatory Visit | Attending: Certified Nurse Midwife | Admitting: Certified Nurse Midwife

## 2021-07-23 ENCOUNTER — Other Ambulatory Visit: Payer: Self-pay | Admitting: Certified Nurse Midwife

## 2021-07-23 ENCOUNTER — Other Ambulatory Visit: Payer: Self-pay | Admitting: Obstetrics and Gynecology

## 2021-07-23 DIAGNOSIS — O3680X0 Pregnancy with inconclusive fetal viability, not applicable or unspecified: Secondary | ICD-10-CM

## 2021-07-23 DIAGNOSIS — Z3201 Encounter for pregnancy test, result positive: Secondary | ICD-10-CM

## 2021-07-30 ENCOUNTER — Other Ambulatory Visit: Payer: Self-pay

## 2021-07-30 ENCOUNTER — Ambulatory Visit (INDEPENDENT_AMBULATORY_CARE_PROVIDER_SITE_OTHER): Payer: BC Managed Care – PPO | Admitting: Certified Nurse Midwife

## 2021-07-30 DIAGNOSIS — Z3A11 11 weeks gestation of pregnancy: Secondary | ICD-10-CM

## 2021-07-30 DIAGNOSIS — Z3401 Encounter for supervision of normal first pregnancy, first trimester: Secondary | ICD-10-CM

## 2021-07-31 LAB — CBC
Hematocrit: 38 % (ref 34.0–46.6)
Hemoglobin: 12.6 g/dL (ref 11.1–15.9)
MCH: 31.2 pg (ref 26.6–33.0)
MCHC: 33.2 g/dL (ref 31.5–35.7)
MCV: 94 fL (ref 79–97)
Platelets: 227 10*3/uL (ref 150–450)
RBC: 4.04 x10E6/uL (ref 3.77–5.28)
RDW: 11.9 % (ref 11.7–15.4)
WBC: 8.6 10*3/uL (ref 3.4–10.8)

## 2021-08-11 ENCOUNTER — Encounter: Payer: BC Managed Care – PPO | Admitting: Certified Nurse Midwife

## 2021-08-19 NOTE — Patient Instructions (Signed)
Common Medications Safe in Pregnancy  Acne:      Constipation:  Benzoyl Peroxide     Colace  Clindamycin      Dulcolax Suppository  Topica Erythromycin     Fibercon  Salicylic Acid      Metamucil         Miralax AVOID:        Senakot   Accutane    Cough:  Retin-A       Cough Drops  Tetracycline      Phenergan w/ Codeine if Rx  Minocycline      Robitussin (Plain & DM)  Antibiotics:     Crabs/Lice:  Ceclor       RID  Cephalosporins    AVOID:  E-Mycins      Kwell  Keflex  Macrobid/Macrodantin   Diarrhea:  Penicillin      Kao-Pectate  Zithromax      Imodium AD         PUSH FLUIDS AVOID:       Cipro     Fever:  Tetracycline      Tylenol (Regular or Extra  Minocycline       Strength)  Levaquin      Extra Strength-Do not          Exceed 8 tabs/24 hrs Caffeine:        <200mg/day (equiv. To 1 cup of coffee or  approx. 3 12 oz sodas)         Gas: Cold/Hayfever:       Gas-X  Benadryl      Mylicon  Claritin       Phazyme  **Claritin-D        Chlor-Trimeton    Headaches:  Dimetapp      ASA-Free Excedrin  Drixoral-Non-Drowsy     Cold Compress  Mucinex (Guaifenasin)     Tylenol (Regular or Extra  Sudafed/Sudafed-12 Hour     Strength)  **Sudafed PE Pseudoephedrine   Tylenol Cold & Sinus     Vicks Vapor Rub  Zyrtec  **AVOID if Problems With Blood Pressure         Heartburn: Avoid lying down for at least 1 hour after meals  Aciphex      Maalox     Rash:  Milk of Magnesia     Benadryl    Mylanta       1% Hydrocortisone Cream  Pepcid  Pepcid Complete   Sleep Aids:  Prevacid      Ambien   Prilosec       Benadryl  Rolaids       Chamomile Tea  Tums (Limit 4/day)     Unisom         Tylenol PM         Warm milk-add vanilla or  Hemorrhoids:       Sugar for taste  Anusol/Anusol H.C.  (RX: Analapram 2.5%)  Sugar Substitutes:  Hydrocortisone OTC     Ok in moderation  Preparation H      Tucks        Vaseline lotion applied to tissue with  wiping    Herpes:     Throat:  Acyclovir      Oragel  Famvir  Valtrex     Vaccines:         Flu Shot Leg Cramps:       *Gardasil  Benadryl      Hepatitis A         Hepatitis B Nasal Spray:         Pneumovax  Saline Nasal Spray     Polio Booster         Tetanus Nausea:       Tuberculosis test or PPD  Vitamin B6 25 mg TID   AVOID:    Dramamine      *Gardasil  Emetrol       Live Poliovirus  Ginger Root 250 mg QID    MMR (measles, mumps &  High Complex Carbs @ Bedtime    rebella)  Sea Bands-Accupressure    Varicella (Chickenpox)  Unisom 1/2 tab TID     *No known complications           If received before Pain:         Known pregnancy;   Darvocet       Resume series after  Lortab        Delivery  Percocet    Yeast:   Tramadol      Femstat  Tylenol 3      Gyne-lotrimin  Ultram       Monistat  Vicodin           MISC:         All Sunscreens           Hair Coloring/highlights          Insect Repellant's          (Including DEET)         Mystic Tans   Second Trimester of Pregnancy The second trimester of pregnancy is from week 13 through week 27. This is also called months 4 through 6 of pregnancy. This is often the time when you feel your best. During the second trimester: Morning sickness is less or has stopped. You may have more energy. You may feel hungry more often. At this time, your unborn baby (fetus) is growing very fast. At the end of the sixth month, the unborn baby may be up to 12 inches long and weigh about 1 pounds. You will likely start to feel the baby move between 16 and 20 weeks of pregnancy. Body changes during your second trimester Your body continues to go through many changes during this time. The changes vary and generally return to normal after the baby is born. Physical changes You will gain more weight. You may start to get stretch marks on your hips, belly (abdomen), and breasts. Your breasts will grow and may hurt. Dark spots or blotches may develop  on your face. A dark line from your belly button to the pubic area (linea nigra) may appear. You may have changes in your hair. Health changes You may have headaches. You may have heartburn. You may have trouble pooping (constipation). You may have hemorrhoids or swollen, bulging veins (varicose veins). Your gums may bleed. You may pee (urinate) more often. You may have back pain. Follow these instructions at home: Medicines Take over-the-counter and prescription medicines only as told by your doctor. Some medicines are not safe during pregnancy. Take a prenatal vitamin that contains at least 600 micrograms (mcg) of folic acid. Eating and drinking Eat healthy meals that include: Fresh fruits and vegetables. Whole grains. Good sources of protein, such as meat, eggs, or tofu. Low-fat dairy products. Avoid raw meat and unpasteurized juice, milk, and cheese. You may need to take these actions to prevent or treat trouble pooping: Drink enough fluids to keep your pee (urine) pale yellow. Eat foods that are high in fiber. These include beans, whole grains, and fresh  fruits and vegetables. Limit foods that are high in fat and sugar. These include fried or sweet foods. Activity Exercise only as told by your doctor. Most people can do their usual exercise during pregnancy. Try to exercise for 30 minutes at least 5 days a week. Stop exercising if you have pain or cramps in your belly or lower back. Do not exercise if it is too hot or too humid, or if you are in a place of great height (high altitude). Avoid heavy lifting. If you choose to, you may have sex unless your doctor tells you not to. Relieving pain and discomfort Wear a good support bra if your breasts are sore. Take warm water baths (sitz baths) to soothe pain or discomfort caused by hemorrhoids. Use hemorrhoid cream if your doctor approves. Rest with your legs raised (elevated) if you have leg cramps or low back pain. If you  develop bulging veins in your legs: Wear support hose as told by your doctor. Raise your feet for 15 minutes, 3-4 times a day. Limit salt in your food. Safety Wear your seat belt at all times when you are in a car. Talk with your doctor if someone is hurting you or yelling at you a lot. Lifestyle Do not use hot tubs, steam rooms, or saunas. Do not douche. Do not use tampons or scented sanitary pads. Avoid cat litter boxes and soil used by cats. These carry germs that can harm your baby and can cause a loss of your baby by miscarriage or stillbirth. Do not use herbal medicines, illegal drugs, or medicines that are not approved by your doctor. Do not drink alcohol. Do not smoke or use any products that contain nicotine or tobacco. If you need help quitting, ask your doctor. General instructions Keep all follow-up visits. This is important. Ask your doctor about local prenatal classes. Ask your doctor about the right foods to eat or for help finding a counselor. Where to find more information American Pregnancy Association: americanpregnancy.org [employer]: www.acog.org [employer] on [employer]: KeywordPortfolios.com.br Contact a doctor if: You have a headache that does not go away when you take medicine. You have changes in how you see, or you see spots in front of your eyes. You have mild cramps, pressure, or pain in your lower belly. You continue to feel like you may vomit (nauseous), you vomit, or you have watery poop (diarrhea). You have bad-smelling fluid coming from your vagina. You have pain when you pee or your pee smells bad. You have very bad swelling of your face, hands, ankles, feet, or legs. You have a fever. Get help right away if: You are leaking fluid from your vagina. You have spotting or bleeding from your vagina. You have very bad belly cramping or pain. You have trouble breathing. You have chest pain. You faint. You  have not felt your baby move for the time period told by your doctor. You have new or increased pain, swelling, or redness in an arm or leg. Summary The second trimester of pregnancy is from week 13 through week 27 (months 4 through 6). Eat healthy meals. Exercise as told by your doctor. Most people can do their usual exercise during pregnancy. Do not use herbal medicines, illegal drugs, or medicines that are not approved by your doctor. Do not drink alcohol. Call your doctor if you get sick or if you notice anything unusual about your pregnancy. This information is not intended to replace advice given  to you by your health care provider. Make sure you discuss any questions you have with your health care provider. Document Revised: 04/29/2020 Document Reviewed: 03/05/2020 Elsevier Patient Education  2022 [employer].

## 2021-08-20 ENCOUNTER — Other Ambulatory Visit (HOSPITAL_COMMUNITY)
Admission: RE | Admit: 2021-08-20 | Discharge: 2021-08-20 | Disposition: A | Discharge status: Home / Self Care | Payer: BC Managed Care – PPO | Source: Ambulatory Visit | Attending: Certified Nurse Midwife | Admitting: Certified Nurse Midwife

## 2021-08-20 ENCOUNTER — Ambulatory Visit (INDEPENDENT_AMBULATORY_CARE_PROVIDER_SITE_OTHER): Payer: BC Managed Care – PPO | Admitting: Certified Nurse Midwife

## 2021-08-20 ENCOUNTER — Encounter: Payer: Self-pay | Admitting: Certified Nurse Midwife

## 2021-08-20 ENCOUNTER — Other Ambulatory Visit: Payer: Self-pay

## 2021-08-20 VITALS — BP 117/80 | HR 91 | Wt 190.3 lb

## 2021-08-20 DIAGNOSIS — Z3482 Encounter for supervision of other normal pregnancy, second trimester: Secondary | ICD-10-CM | POA: Diagnosis present

## 2021-08-20 DIAGNOSIS — Z3A14 14 weeks gestation of pregnancy: Secondary | ICD-10-CM | POA: Diagnosis present

## 2021-08-20 LAB — POCT URINALYSIS DIPSTICK OB
Bilirubin, UA: NEGATIVE
Blood, UA: NEGATIVE
Glucose, UA: NEGATIVE
Ketones, UA: NEGATIVE
Leukocytes, UA: NEGATIVE
Nitrite, UA: NEGATIVE
POC,PROTEIN,UA: NEGATIVE
Spec Grav, UA: 1.01 (ref 1.010–1.025)
Urobilinogen, UA: 0.2 E.U./dL
pH, UA: 7.5 (ref 5.0–8.0)

## 2021-08-20 NOTE — Progress Notes (Signed)
NEW OB HISTORY AND PHYSICAL  SUBJECTIVE:       Bethany Gutierrez is a 30 y.o. [id number] female, Patient's last menstrual period was 05/12/2021 (approximate)., Estimated Date of Delivery: 02/16/22, [redacted]w[redacted]d, presents today for establishment of Prenatal Care. She has no unusual complaints.      Gynecologic History Patient's last menstrual period was 05/12/2021 (approximate). Normal Contraception: none Last Pap: 08/21/18. Results were: normal  Obstetric History OB History  Gravida Para Term Preterm AB Living  2 1 1     1   SAB IAB Ectopic Multiple Live Births        0 1    # Outcome Date GA Lbr Len/2nd Weight Sex Delivery Anes PTL Lv  2 Current           1 Term 02/22/19 [redacted]w[redacted]d  6 lb 10.2 oz (3.01 kg) F CS-LTranv Spinal  LIV    Past Medical History:  Diagnosis Date   Asthma    Environmental and seasonal allergies    Family history of adverse reaction to anesthesia    mother and sister post op nausea and vomiting   GERD (gastroesophageal reflux disease)    Seasonal allergies     Past Surgical History:  Procedure Laterality Date   CESAREAN SECTION N/A 02/22/2019   Procedure: CESAREAN SECTION;  Surgeon: 02/24/2019, MD;  Location: [facility] ORS;  Service: Obstetrics;  Laterality: N/A;   TONSILLECTOMY      Current Outpatient Medications on File Prior to Visit  Medication Sig Dispense Refill   Pediatric Multivitamins-Iron (FLINTSTONES COMPLETE PO) Take by mouth.     No current facility-administered medications on file prior to visit.    Allergies  Allergen Reactions   Levofloxacin Other (See Comments)    She states it kept her up all night and gave her a really bad migraine.   Bacitracin Hives   Codeine Nausea And Vomiting    rash   Prednisone Other (See Comments)    Sensative Sensative    Sulfamethoxazole-Trimethoprim Nausea And Vomiting and Other (See Comments)    Nausea and vomiting Nausea and vomiting    Tramadol Nausea And Vomiting   Vicodin  [Hydrocodone-Acetaminophen] Nausea And Vomiting and Rash    Social History   Socioeconomic History   Marital status: Married    Spouse name: Tanner   Number of children: Not on file   Years of education: Not on file   Highest education level: Not on file  Occupational History   Not on file  Tobacco Use   Smoking status: Never   Smokeless tobacco: Never  Vaping Use   Vaping Use: Never used  Substance and Sexual Activity   Alcohol use: No   Drug use: No   Sexual activity: Yes    Birth control/protection: None  Other Topics Concern   Not on file  Social History Narrative   Not on file   Social Determinants of Health   Financial Resource Strain: Not on file  Food Insecurity: Not on file  Transportation Needs: Not on file  Physical Activity: Not on file  Stress: Not on file  Social Connections: Not on file  Intimate Partner Violence: Not on file    Family History  Problem Relation Age of Onset   Ovarian cancer Mother    Diabetes Maternal Grandmother    Hypertension Maternal Grandmother    Lung cancer Paternal Grandmother     The following portions of the patient's history were reviewed and updated as appropriate: allergies, current medications,  past OB history, past medical history, past surgical history, past family history, past social history, and problem list.    OBJECTIVE: Initial Physical Exam (New OB)  GENERAL APPEARANCE: alert, well appearing, in no apparent distress, oriented to person, place and time HEAD: normocephalic, atraumatic MOUTH: mucous membranes moist, pharynx normal without lesions THYROID: no thyromegaly or masses present BREASTS: no masses noted, no significant tenderness, no palpable axillary nodes, no skin changes LUNGS: clear to auscultation, no wheezes, rales or rhonchi, symmetric air entry HEART: regular rate and rhythm, no murmurs ABDOMEN: soft, nontender, nondistended, no abnormal masses, no epigastric pain and FHT  present EXTREMITIES: no redness or tenderness in the calves or thighs, no edema, no limitation in range of motion, intact peripheral pulses SKIN: normal coloration and turgor, no rashes LYMPH NODES: no adenopathy palpable NEUROLOGIC: alert, oriented, normal speech, no focal findings or movement disorder noted  PELVIC EXAM EXTERNAL GENITALIA: normal appearing vulva with no masses, tenderness or lesions VAGINA: no abnormal discharge or lesions CERVIX: no lesions or cervical motion tenderness and nabothian cyst present . Pap completed UTERUS: gravid ADNEXA: no masses palpable and nontender OB EXAM PELVIMETRY: appears adequate RECTUM: exam not indicated  ASSESSMENT: Normal pregnancy  PLAN: Prenatal care See orders New OB counseling: The patient has been given an overview regarding routine prenatal care. Recommendations regarding diet, weight gain, and exercise in pregnancy were given. Prenatal testing, optional genetic testing, carrier screening and ultrasound use in pregnancy were reviewed.  Has had genetic testing completed already. Benefits of Breast Feeding were discussed. The patient is encouraged to consider nursing her baby post partum.   Doreene Burke, CNM

## 2021-08-20 NOTE — Progress Notes (Signed)
   NOB-Pt present for initial prenatal care. Pt stated fetal movement present; no hick contractions present; no vaginal bleeding and no changes in vaginal discharge.     Pt c/o mild morning sickness.

## 2021-08-26 LAB — CYTOLOGY - PAP: Diagnosis: NEGATIVE

## 2021-09-02 ENCOUNTER — Telehealth: Payer: Self-pay | Admitting: Certified Nurse Midwife

## 2021-09-02 NOTE — Telephone Encounter (Signed)
Bethany Gutierrez called in and stated that she thinks she pulled something in her neck.  She states all day yesterday and was having shooting pains in her neck and shoulders.  Patient states now the pain has reached her stomach.  Patient states every time she takes a dep breath, burps, or hiccups, a shooting pain radiates through her stomach.  Patient would like to know if she should be concerned or worried?  Please advise.

## 2021-09-02 NOTE — Telephone Encounter (Signed)
Called patient to see how she is feeling. Pain is about the same it is a shooting pain that comes and goes. She has been taking Tylenol for pain, with no relief. Advised by Dr. Logan Bores to tell patient to give it a little time. If pain does not resolve or gets worse go to ED or UC.

## 2021-09-14 ENCOUNTER — Encounter: Payer: Self-pay | Admitting: Certified Nurse Midwife

## 2021-09-14 ENCOUNTER — Other Ambulatory Visit: Payer: Self-pay

## 2021-09-14 ENCOUNTER — Ambulatory Visit (INDEPENDENT_AMBULATORY_CARE_PROVIDER_SITE_OTHER): Payer: BC Managed Care – PPO | Admitting: Certified Nurse Midwife

## 2021-09-14 ENCOUNTER — Ambulatory Visit (INDEPENDENT_AMBULATORY_CARE_PROVIDER_SITE_OTHER): Payer: BC Managed Care – PPO

## 2021-09-14 VITALS — BP 120/83 | HR 97 | Wt 195.7 lb

## 2021-09-14 DIAGNOSIS — Z3482 Encounter for supervision of other normal pregnancy, second trimester: Secondary | ICD-10-CM | POA: Diagnosis not present

## 2021-09-14 DIAGNOSIS — Z3A14 14 weeks gestation of pregnancy: Secondary | ICD-10-CM | POA: Diagnosis not present

## 2021-09-14 LAB — POCT URINALYSIS DIPSTICK OB
Bilirubin, UA: NEGATIVE
Blood, UA: NEGATIVE
Glucose, UA: NEGATIVE
Ketones, UA: NEGATIVE
Leukocytes, UA: NEGATIVE
Nitrite, UA: NEGATIVE
POC,PROTEIN,UA: NEGATIVE
Spec Grav, UA: 1.015 (ref 1.010–1.025)
Urobilinogen, UA: 0.2 E.U./dL
pH, UA: 6 (ref 5.0–8.0)

## 2021-09-14 NOTE — Progress Notes (Signed)
ROB doing well, feeling movement. Anatomy scan today. Report not available , per u/s tech u/s normal and complete. Discussed round ligament pain with pt, self help measures reviewed. Follow up 4 wk,   Doreene Burke, CNM

## 2021-09-14 NOTE — Patient Instructions (Signed)
Round Ligament Pain The round ligaments are a pair of cord-like tissues that help support the uterus. They can become a source of pain during pregnancy as the ligaments soften and stretch as the baby grows. The pain usually begins in the second trimester (13-28 weeks) of pregnancy, and should only last for a few seconds when it occurs. However, the pain can come and go until the baby is delivered. The pain does not cause harm to the baby. Round ligament pain is usually a short, sharp, and pinching pain, but it can also be a dull, lingering, and aching pain. The pain is felt in the lower side of the abdomen or in the groin. It usually starts deep in the groin and moves up to the outside of the hip area. The pain may happen when you: Suddenly change position, such as quickly going from a sitting to standing position. Do physical activity. Cough or sneeze. Follow these instructions at home: Managing pain  When the pain starts, relax. Then, try any of these methods to help with the pain: Sit down. Flex your knees up to your abdomen. Lie on your side with one pillow under your abdomen and another pillow between your legs. Sit in a warm bath for 15-20 minutes or until the pain goes away. General instructions Watch your condition for any changes. Move slowly when you sit down or stand up. Stop or reduce your physical activities if they cause pain. Avoid long walks if they cause pain. Take over-the-counter and prescription medicines only as told by your health care provider. Keep all follow-up visits. This is important. Contact a health care provider if: Your pain does not go away with treatment. You feel pain in your back that you did not have before. Your medicine is not helping. You have a fever or chills. You have nausea or vomiting. You have diarrhea. You have pain when you urinate. Get help right away if: You have pain that is a rhythmic, cramping pain similar to labor pains. Labor pains  are usually 2 minutes apart, last for about 1 minute, and involve a bearing down feeling or pressure in your pelvis. You have vaginal bleeding. These symptoms may represent a serious problem that is an emergency. Do not wait to see if the symptoms will go away. Get medical help right away. Call your local emergency services (911 in the [place] Do not drive yourself to the hospital. Summary Round ligament pain is felt in the lower abdomen or groin. This pain usually begins in the second trimester (13-28 weeks) and should only last for a few seconds when it occurs. You may notice the pain when you suddenly change position, when you cough or sneeze, or during physical activity. Relaxing, flexing your knees to your abdomen, lying on one side, or taking a warm bath may help to get rid of the pain. Contact your health care provider if the pain does not go away. This information is not intended to replace advice given to you by your health care provider. Make sure you discuss any questions you have with your health care provider. Document Revised: 02/03/2021 Document Reviewed: 02/03/2021 Elsevier Patient Education  2022 Elsevier Inc.  

## 2021-10-15 ENCOUNTER — Ambulatory Visit (INDEPENDENT_AMBULATORY_CARE_PROVIDER_SITE_OTHER): Payer: BC Managed Care – PPO | Admitting: Certified Nurse Midwife

## 2021-10-15 ENCOUNTER — Other Ambulatory Visit: Payer: Self-pay

## 2021-10-15 ENCOUNTER — Encounter: Payer: Self-pay | Admitting: Certified Nurse Midwife

## 2021-10-15 VITALS — BP 113/78 | HR 80 | Wt 200.2 lb

## 2021-10-15 DIAGNOSIS — Z3A23 23 weeks gestation of pregnancy: Secondary | ICD-10-CM

## 2021-10-15 DIAGNOSIS — Z3482 Encounter for supervision of other normal pregnancy, second trimester: Secondary | ICD-10-CM

## 2021-10-15 LAB — POCT URINALYSIS DIPSTICK OB
Bilirubin, UA: NEGATIVE
Blood, UA: NEGATIVE
Glucose, UA: NEGATIVE
Ketones, UA: NEGATIVE
Leukocytes, UA: NEGATIVE
Nitrite, UA: NEGATIVE
POC,PROTEIN,UA: NEGATIVE
Spec Grav, UA: 1.02 (ref 1.010–1.025)
Urobilinogen, UA: 0.2 E.U./dL
pH, UA: 7 (ref 5.0–8.0)

## 2021-10-15 NOTE — Patient Instructions (Signed)

## 2021-10-15 NOTE — Progress Notes (Signed)
ROB , doing well, feeling good movement. Discussed glucose screen next visit. Information sheet on eating prior to visit given. Follow up 4 wks for ROB and glucose screen.   Doreene Burke, CNM

## 2021-10-15 NOTE — Progress Notes (Signed)
OB-Pt present for routine prenatal care. Pt stated that she was doing well. Pt stated that she would get her flu vaccine at a later date.

## 2021-10-29 ENCOUNTER — Encounter: Payer: Self-pay | Admitting: Certified Nurse Midwife

## 2021-11-12 ENCOUNTER — Ambulatory Visit (INDEPENDENT_AMBULATORY_CARE_PROVIDER_SITE_OTHER): Payer: BC Managed Care – PPO | Admitting: Certified Nurse Midwife

## 2021-11-12 ENCOUNTER — Other Ambulatory Visit: Payer: BC Managed Care – PPO

## 2021-11-12 ENCOUNTER — Other Ambulatory Visit: Payer: Self-pay

## 2021-11-12 VITALS — BP 104/67 | HR 89 | Wt 202.0 lb

## 2021-11-12 DIAGNOSIS — Z3A27 27 weeks gestation of pregnancy: Secondary | ICD-10-CM

## 2021-11-12 DIAGNOSIS — Z3482 Encounter for supervision of other normal pregnancy, second trimester: Secondary | ICD-10-CM

## 2021-11-12 DIAGNOSIS — Z23 Encounter for immunization: Secondary | ICD-10-CM | POA: Diagnosis not present

## 2021-11-12 DIAGNOSIS — Z131 Encounter for screening for diabetes mellitus: Secondary | ICD-10-CM

## 2021-11-12 DIAGNOSIS — Z113 Encounter for screening for infections with a predominantly sexual mode of transmission: Secondary | ICD-10-CM

## 2021-11-12 DIAGNOSIS — Z3A28 28 weeks gestation of pregnancy: Secondary | ICD-10-CM

## 2021-11-12 NOTE — Progress Notes (Signed)
ROB doing well. Feels good movement. 28 wk labs today: Glucose screen/RPR/CBC. Tdap, Blood transfusion consent completed, all questions answered. Ready set baby reviewed, see check list for topics covered. Sample birth plan given, will follow up in upcoming visits. Discussed birth control after delivery, information pamphlet given.   Follow up 2 wk for ROB or sooner if needed.    Tony Friscia, CNM 

## 2021-11-12 NOTE — Progress Notes (Signed)
ROB- TDAP/BT consent signed today, no concerns

## 2021-11-13 LAB — CBC
Hematocrit: 34.3 % (ref 34.0–46.6)
Hemoglobin: 11.9 g/dL (ref 11.1–15.9)
MCH: 31.9 pg (ref 26.6–33.0)
MCHC: 34.7 g/dL (ref 31.5–35.7)
MCV: 92 fL (ref 79–97)
Platelets: 256 10*3/uL (ref 150–450)
RBC: 3.73 x10E6/uL — ABNORMAL LOW (ref 3.77–5.28)
RDW: 11.4 % — ABNORMAL LOW (ref 11.7–15.4)
WBC: 9.2 10*3/uL (ref 3.4–10.8)

## 2021-11-13 LAB — GLUCOSE, 1 HOUR GESTATIONAL: Gestational Diabetes Screen: 117 mg/dL (ref 70–139)

## 2021-11-13 LAB — RPR: RPR Ser Ql: NONREACTIVE

## 2021-11-15 ENCOUNTER — Telehealth: Payer: Self-pay | Admitting: Certified Nurse Midwife

## 2021-11-15 NOTE — Telephone Encounter (Signed)
Pt is calling in stating that she was wanting to see if Bethany Gutierrez wanted her to come back in two weeks or what she wanted her to do.  Pt stated that the check-out person was going to check with Bethany Gutierrez and let her know.  Pt is aware that someone from the office will give her a call once Bethany Gutierrez replies back as to what she need to have done.

## 2021-12-07 ENCOUNTER — Encounter: Payer: Self-pay | Admitting: Certified Nurse Midwife

## 2021-12-07 ENCOUNTER — Ambulatory Visit (INDEPENDENT_AMBULATORY_CARE_PROVIDER_SITE_OTHER): Payer: BC Managed Care – PPO | Admitting: Certified Nurse Midwife

## 2021-12-07 ENCOUNTER — Other Ambulatory Visit: Payer: Self-pay

## 2021-12-07 VITALS — BP 111/80 | HR 109 | Wt 207.9 lb

## 2021-12-07 DIAGNOSIS — Z3483 Encounter for supervision of other normal pregnancy, third trimester: Secondary | ICD-10-CM

## 2021-12-07 DIAGNOSIS — Z3A31 31 weeks gestation of pregnancy: Secondary | ICD-10-CM

## 2021-12-07 LAB — POCT URINALYSIS DIPSTICK OB
Bilirubin, UA: NEGATIVE
Blood, UA: NEGATIVE
Glucose, UA: NEGATIVE
Ketones, UA: NEGATIVE
Leukocytes, UA: NEGATIVE
Nitrite, UA: NEGATIVE
POC,PROTEIN,UA: NEGATIVE
Spec Grav, UA: 1.01 (ref 1.010–1.025)
Urobilinogen, UA: 0.2 E.U./dL
pH, UA: 7 (ref 5.0–8.0)

## 2021-12-07 NOTE — Patient Instructions (Signed)
[place] [facility] List  [facility]  [address], [place], [place] [address]  Phone: [phone]  [facility] (second location)  [address] [place], [place] [address]  Phone: [phone]  [facility] ([place]) [address], [place], [place] [address] Phone: [phone]  [facility]  [address] [place], [place] [address]  Phone: [phone] 

## 2021-12-07 NOTE — Progress Notes (Signed)
ROB dong well, feeling good movement. Disucssed using spinning babies and miles circuit at home to help encourage vertex postioning. She will see Mds in 2 wks  for TOLAC counseling , then Missy for ROB. She verbalizes and agrees to plan.   Doreene Burke, CNM

## 2021-12-09 ENCOUNTER — Other Ambulatory Visit: Payer: Self-pay

## 2021-12-09 ENCOUNTER — Observation Stay
Admission: EM | Admit: 2021-12-09 | Discharge: 2021-12-10 | Disposition: A | Discharge status: Home / Self Care | Payer: BC Managed Care – PPO | Attending: Obstetrics | Admitting: Obstetrics

## 2021-12-09 ENCOUNTER — Other Ambulatory Visit: Payer: Self-pay | Admitting: Obstetrics

## 2021-12-09 ENCOUNTER — Encounter: Payer: Self-pay | Admitting: Obstetrics and Gynecology

## 2021-12-09 DIAGNOSIS — Z3A31 31 weeks gestation of pregnancy: Secondary | ICD-10-CM | POA: Diagnosis not present

## 2021-12-09 DIAGNOSIS — O99513 Diseases of the respiratory system complicating pregnancy, third trimester: Secondary | ICD-10-CM | POA: Diagnosis not present

## 2021-12-09 DIAGNOSIS — N200 Calculus of kidney: Secondary | ICD-10-CM

## 2021-12-09 DIAGNOSIS — O26833 Pregnancy related renal disease, third trimester: Principal | ICD-10-CM | POA: Insufficient documentation

## 2021-12-09 DIAGNOSIS — O219 Vomiting of pregnancy, unspecified: Secondary | ICD-10-CM | POA: Diagnosis not present

## 2021-12-09 DIAGNOSIS — O26893 Other specified pregnancy related conditions, third trimester: Secondary | ICD-10-CM | POA: Diagnosis present

## 2021-12-09 DIAGNOSIS — R10A1 Flank pain, right side: Secondary | ICD-10-CM

## 2021-12-09 DIAGNOSIS — R109 Unspecified abdominal pain: Secondary | ICD-10-CM

## 2021-12-09 DIAGNOSIS — N1330 Unspecified hydronephrosis: Secondary | ICD-10-CM | POA: Diagnosis not present

## 2021-12-09 DIAGNOSIS — J45909 Unspecified asthma, uncomplicated: Secondary | ICD-10-CM | POA: Diagnosis not present

## 2021-12-09 DIAGNOSIS — O35EXX0 Maternal care for other (suspected) fetal abnormality and damage, fetal genitourinary anomalies, not applicable or unspecified: Secondary | ICD-10-CM

## 2021-12-09 LAB — COMPREHENSIVE METABOLIC PANEL
ALT: 11 U/L (ref 0–44)
AST: 21 U/L (ref 15–41)
Albumin: 3.1 g/dL — ABNORMAL LOW (ref 3.5–5.0)
Alkaline Phosphatase: 104 U/L (ref 38–126)
Anion gap: 11 (ref 5–15)
BUN: 10 mg/dL (ref 6–20)
CO2: 18 mmol/L — ABNORMAL LOW (ref 22–32)
Calcium: 8.6 mg/dL — ABNORMAL LOW (ref 8.9–10.3)
Chloride: 104 mmol/L (ref 98–111)
Creatinine, Ser: 0.44 mg/dL (ref 0.44–1.00)
GFR, Estimated: >60 mL/min (ref >60)
Glucose, Bld: 91 mg/dL (ref 70–99)
Potassium: 3.8 mmol/L (ref 3.5–5.1)
Sodium: 133 mmol/L — ABNORMAL LOW (ref 135–145)
Total Bilirubin: 0.6 mg/dL (ref 0.3–1.2)
Total Protein: 6.5 g/dL (ref 6.5–8.1)

## 2021-12-09 LAB — CBC
HCT: 36.2 % (ref 36.0–46.0)
Hemoglobin: 11.9 g/dL — ABNORMAL LOW (ref 12.0–15.0)
MCH: 31.3 pg (ref 26.0–34.0)
MCHC: 32.9 g/dL (ref 30.0–36.0)
MCV: 95.3 fL (ref 80.0–100.0)
Platelets: 206 10*3/uL (ref 150–400)
RBC: 3.8 MIL/uL — ABNORMAL LOW (ref 3.87–5.11)
RDW: 12.4 % (ref 11.5–15.5)
WBC: 11.1 10*3/uL — ABNORMAL HIGH (ref 4.0–10.5)
nRBC: 0 % (ref 0.0–0.2)

## 2021-12-09 MED ORDER — ONDANSETRON 4 MG PO TBDP
ORAL_TABLET | ORAL | Status: AC
  Filled 2021-12-09: qty 1

## 2021-12-09 MED ORDER — MORPHINE SULFATE (PF) 2 MG/ML IV SOLN
1.0000 mg | INTRAVENOUS | Status: DC | PRN
  Administered 2021-12-09 – 2021-12-10 (×3): 1 mg via INTRAVENOUS
  Filled 2021-12-09 (×3): qty 1

## 2021-12-09 MED ORDER — ONDANSETRON 4 MG PO TBDP
4.0000 mg | ORAL_TABLET | Freq: Once | ORAL | Status: AC
  Administered 2021-12-09: 4 mg via ORAL

## 2021-12-09 MED ORDER — ACETAMINOPHEN 500 MG PO TABS
1000.0000 mg | ORAL_TABLET | Freq: Four times a day (QID) | ORAL | Status: DC | PRN
  Administered 2021-12-09: 1000 mg via ORAL
  Filled 2021-12-09: qty 2

## 2021-12-09 MED ORDER — LACTATED RINGERS IV SOLN: INTRAVENOUS | Status: DC

## 2021-12-09 MED ORDER — PROMETHAZINE HCL 25 MG/ML IJ SOLN
25.0000 mg | Freq: Four times a day (QID) | INTRAMUSCULAR | Status: DC | PRN
  Administered 2021-12-10: 25 mg via INTRAVENOUS
  Filled 2021-12-09: qty 1
  Filled 2021-12-09: qty 25

## 2021-12-09 NOTE — H&P (Addendum)
OB Triage Note  SUBJECTIVE  HPI Bethany Gutierrez is a 31 y.o. G2P1001 at [redacted]w[redacted]d  who presents to Labor & Delivery for acute onset right flank pain. She states that she had been feeling off all day, with pressure in her groin and pelvis since the morning. The pain gradually worsened throughout the day. Around 1845, she started feeling tight pain that radiated from her lower right abdomen to her back. She describes the pain as constant pressure with occasional stabbing pain. She rates it as 10/10. She denies dysuria, but reports increased urinary frequency and urine that is darker than normal. She reports nausea and has vomited once since admission to the hospital. She reports that she has had kidney stones in the past, and this pain is similar to her previous experience. She denies any recent trauma or injury and states she has been sitting at her desk most of the day. She denies contractions, leaking of fluid, and vaginal bleeding.  OB History     Gravida  2   Para  1   Term  1   Preterm      AB      Living  1      SAB      IAB      Ectopic      Multiple  0   Live Births  1            Past Medical History:  Diagnosis Date   Asthma    Environmental and seasonal allergies    Family history of adverse reaction to anesthesia    mother and sister post op nausea and vomiting   GERD (gastroesophageal reflux disease)    Seasonal allergies    Past Surgical History:  Procedure Laterality Date   CESAREAN SECTION N/A 02/22/2019   Procedure: CESAREAN SECTION;  Surgeon: Linzie Collin, MD;  Location: [facility] ORS;  Service: Obstetrics;  Laterality: N/A;   TONSILLECTOMY      Allergies  Allergen Reactions   Levofloxacin Other (See Comments)    She states it kept her up all night and gave her a really bad migraine.   Bacitracin Hives   Codeine Nausea And Vomiting    rash   Prednisone Other (See Comments)    Sensative Sensative    Sulfamethoxazole-Trimethoprim Nausea And  Vomiting and Other (See Comments)    Nausea and vomiting Nausea and vomiting    Tramadol Nausea And Vomiting   Vicodin [Hydrocodone-Acetaminophen] Nausea And Vomiting and Rash    No current facility-administered medications on file prior to encounter.   Current Outpatient Medications on File Prior to Encounter  Medication Sig Dispense Refill   Pediatric Multivitamins-Iron (FLINTSTONES COMPLETE PO) Take by mouth.      ROS Review of Systems  Constitutional:  Positive for chills and malaise/fatigue. Negative for fever.  HENT:  Negative for congestion and ear pain.   Eyes:  Negative for blurred vision and double vision.  Respiratory:  Negative for cough, shortness of breath and wheezing.   Cardiovascular:  Negative for chest pain and palpitations.  Gastrointestinal:  Positive for abdominal pain, nausea and vomiting.  Genitourinary:  Positive for flank pain and frequency. Negative for dysuria and urgency.  Musculoskeletal:  Positive for back pain.  Skin:  Negative for itching and rash.  Neurological:  Negative for dizziness and headaches.    OBJECTIVE  BP 123/82 (BP Location: Right Arm)    Pulse 81    Temp 98 F (36.7 C) (Oral)  Resp 16    LMP 05/03/2021    NST Baseline: 140 Variability: Moderate Accels: Present Decels: Absent Toco: contractions q 4 min on monitor; Rasheeda states she does not feel the contractions Reactive NST. Category 1.  Physical Exam Constitutional:      Appearance: She is well-developed. She is ill-appearing.  HENT:     Head: Normocephalic and atraumatic.  Cardiovascular:     Rate and Rhythm: Normal rate and regular rhythm.     Heart sounds: Normal heart sounds.  Pulmonary:     Effort: Pulmonary effort is normal.     Breath sounds: Normal breath sounds.  Abdominal:     General: Bowel sounds are normal.     Palpations: Abdomen is soft.     Tenderness: There is right CVA tenderness. There is no left CVA tenderness. Negative signs include McBurney's  sign.     Comments: Gravid; tender to palpation from directly above right hip bone to mid-back. Left side of abdomen and back are nontender  Neurological:     Mental Status: She is alert.    ASSESSMENT  1) Acute right flank pain with nausea and vomiting Suspect kidney stones or hydronephrosis Differentials: Appendicitis, musculoskeletal injury 2) Pregnancy at [redacted]w[redacted]d 3) Reassuring fetal status  PLAN  1) CBC, CMP, UA, urine culture 2) Renal US 3) IV hydration with LR 4) Pain control - IV morphine until able to tolerate PO medication 5) N/V management - has received one dose of PO Zofran. IV Phenergan for continued N/V 6) Monitor for improvement in pain and ability to tolerate POs 7) Consulted with Dr. Valentino Saxon, who is in agreement with plan  Guadlupe Spanish, CNM

## 2021-12-09 NOTE — OB Triage Note (Signed)
Pt is a 30y/o G2P1 at [redacted]w[redacted]d with c/o right abdominal and back pain that began about 7pm, but states she was feeling off all day. Pt rates pain as a pressure/tightening sensation with occasional stabbing, 10/10. Pt states +FM. Pt denies LOF, CTX and VB. Monitors applied and assessing. Initial FHT 135.

## 2021-12-10 ENCOUNTER — Observation Stay: Payer: BC Managed Care – PPO

## 2021-12-10 ENCOUNTER — Encounter: Payer: Self-pay | Admitting: Obstetrics

## 2021-12-10 DIAGNOSIS — N1330 Unspecified hydronephrosis: Secondary | ICD-10-CM

## 2021-12-10 DIAGNOSIS — O35EXX0 Maternal care for other (suspected) fetal abnormality and damage, fetal genitourinary anomalies, not applicable or unspecified: Secondary | ICD-10-CM

## 2021-12-10 HISTORY — DX: Maternal care for other (suspected) fetal abnormality and damage, fetal genitourinary anomalies, not applicable or unspecified: O35.EXX0

## 2021-12-10 LAB — URINALYSIS, ROUTINE W REFLEX MICROSCOPIC
Bilirubin Urine: NEGATIVE
Glucose, UA: NEGATIVE mg/dL
Ketones, ur: 20 mg/dL — AB
Leukocytes,Ua: NEGATIVE
Nitrite: NEGATIVE
Protein, ur: NEGATIVE mg/dL
RBC / HPF: >50 RBC/hpf — ABNORMAL HIGH (ref 0–5)
Specific Gravity, Urine: 1.017 (ref 1.005–1.030)
pH: 5 (ref 5.0–8.0)

## 2021-12-10 MED ORDER — ONDANSETRON HCL 4 MG/2ML IJ SOLN
4.0000 mg | Freq: Three times a day (TID) | INTRAMUSCULAR | Status: DC | PRN
  Administered 2021-12-10: 4 mg via INTRAVENOUS
  Filled 2021-12-10: qty 2

## 2021-12-10 MED ORDER — ONDANSETRON HCL 4 MG PO TABS: 4.0000 mg | ORAL_TABLET | Freq: Three times a day (TID) | ORAL | 0 refills | Status: AC | PRN

## 2021-12-10 MED ORDER — OXYCODONE-ACETAMINOPHEN 5-325 MG PO TABS
1.0000 | ORAL_TABLET | Freq: Four times a day (QID) | ORAL | Status: DC | PRN
  Administered 2021-12-10: 2 via ORAL
  Filled 2021-12-10: qty 2

## 2021-12-10 MED ORDER — ONDANSETRON HCL 4 MG/2ML IJ SOLN: 4.0000 mg | Freq: Three times a day (TID) | INTRAMUSCULAR | Status: DC | PRN

## 2021-12-10 MED ORDER — OXYCODONE-ACETAMINOPHEN 5-325 MG PO TABS: 1.0000 | ORAL_TABLET | ORAL | 0 refills | Status: DC | PRN

## 2021-12-10 MED ORDER — OXYCODONE-ACETAMINOPHEN 5-325 MG PO TABS: 1.0000 | ORAL_TABLET | Freq: Four times a day (QID) | ORAL | Status: DC | PRN

## 2021-12-10 NOTE — Progress Notes (Signed)
Pt to be d/c home and to self care. Pt given teaching on when to seek medical attention (LOF, VB and decreased FM, etc..). Pt verbalized understanding of d/c instructions.   ?

## 2021-12-10 NOTE — Discharge Summary (Addendum)
L&D OB TRIAGE NOTE  SUBJECTIVE Bethany Gutierrez  is a 31 y.o. G2P1001 at [redacted]w[redacted]d who presented to L&D today for acute flank pain on the right side. US showed mild hydronephrosis of the right kidney. Pain and nausea have improved with IV hydration, antiemetics, and morphine. She has remained afebrile with stable vital signs, and fetal status is reassuring. She is now tolerating PO fluids and medication. She feels ready to be discharged home.  OB History     Gravida  2   Para  1   Term  1   Preterm      AB      Living  1      SAB      IAB      Ectopic      Multiple  0   Live Births  1           OBJECTIVE  Scheduled Meds:  ondansetron       Continuous Infusions:   PRN Meds:.  BP 107/63 (BP Location: Left Arm)    Pulse 97    Temp 98.3 F (36.8 C) (Oral)    Resp 16    LMP 05/03/2021  CBC    Component Value Date/Time   WBC 11.1 (H) 12/09/2021 2219   RBC 3.80 (L) 12/09/2021 2219   HGB 11.9 (L) 12/09/2021 2219   HGB 11.9 11/12/2021 1006   HCT 36.2 12/09/2021 2219   HCT 34.3 11/12/2021 1006   PLT 206 12/09/2021 2219   PLT 256 11/12/2021 1006   MCV 95.3 12/09/2021 2219   MCV 92 11/12/2021 1006   MCV 94 12/21/2011 0722   MCH 31.3 12/09/2021 2219   MCHC 32.9 12/09/2021 2219   RDW 12.4 12/09/2021 2219   RDW 11.4 (L) 11/12/2021 1006   RDW 10.9 (L) 12/21/2011 0722   LYMPHSABS 1.4 08/10/2018 0947   EOSABS 0.0 08/10/2018 0947   BASOSABS 0.0 08/10/2018 0947   CMP     Component Value Date/Time   NA 133 (L) 12/09/2021 2219   NA 139 07/09/2020 0949   NA 142 12/21/2011 0722   K 3.8 12/09/2021 2219   K 4.0 12/21/2011 0722   CL 104 12/09/2021 2219   CL 107 12/21/2011 0722   CO2 18 (L) 12/09/2021 2219   CO2 22 12/21/2011 0722   GLUCOSE 91 12/09/2021 2219   GLUCOSE 99 12/21/2011 0722   BUN 10 12/09/2021 2219   BUN 10 07/09/2020 0949   BUN 13 12/21/2011 0722   CREATININE 0.44 12/09/2021 2219   CREATININE 0.74 12/21/2011 0722   CALCIUM 8.6 (L) 12/09/2021 2219    CALCIUM 9.0 12/21/2011 0722   PROT 6.5 12/09/2021 2219   PROT 6.9 07/09/2020 0949   PROT 7.4 12/21/2011 0722   ALBUMIN 3.1 (L) 12/09/2021 2219   ALBUMIN 4.7 07/09/2020 0949   ALBUMIN 4.2 12/21/2011 0722   AST 21 12/09/2021 2219   AST 22 12/21/2011 0722   ALT 11 12/09/2021 2219   ALT 18 12/21/2011 0722   ALKPHOS 104 12/09/2021 2219   ALKPHOS 57 (L) 12/21/2011 0722   BILITOT 0.6 12/09/2021 2219   BILITOT 0.3 07/09/2020 0949   BILITOT 0.4 12/21/2011 0722   GFRNONAA >60 12/09/2021 2219   GFRNONAA >60 12/21/2011 0722   GFRAA 141 07/09/2020 0949   GFRAA >60 12/21/2011 0722    UA and UC pending  NST  Fetal status is reassuring. Baseline: 140 Variability: Moderate Accelerations: Present Decelerations: Absent Reactive NST; Category 1  ASSESSMENT  1)  Hydronephrosis of right kidney 2) Pain and nausea improved 3) Pregnancy at [redacted]w[redacted]d 4) Reassuring fetal status  PLAN  1) Discharge home. 2) Hydration and rest. 3) Tylenol for mild-moderate pain; Percocet for severe pain. Rx sent to patient's pharmacy. 4) Keep scheduled ROB. Call for temperature of 100.4 or greater, worsening pain, dysuria, vaginal bleeding, LOF, or fetal concerns.  I reviewed this plan Dr. Marcelline Mates and she is in agreement.  Discussed discharge instructions with Bethany Gutierrez and she verbalizes understanding.  Lloyd Huger, CNM

## 2021-12-10 NOTE — Discharge Instructions (Addendum)
Rest and hydrate. May take Percocet as needed for severe pain or Tylenol for mild/moderate pain. Call for temperature of 100.4 or greater, worsening pain, or inability to eat/drink. Call for decreased fetal movement, contractions, bleeding, or leaking fluid.

## 2021-12-11 LAB — URINE CULTURE

## 2021-12-13 ENCOUNTER — Other Ambulatory Visit: Payer: Self-pay

## 2021-12-13 ENCOUNTER — Other Ambulatory Visit: Payer: BC Managed Care – PPO

## 2021-12-13 DIAGNOSIS — R399 Unspecified symptoms and signs involving the genitourinary system: Secondary | ICD-10-CM

## 2021-12-13 NOTE — Telephone Encounter (Signed)
I spoke to [place] this morning to let her know her UC needed to be recollected. She states she can come by either this afternoon or tomorrow morning to leave a sample. Her pain has improved, but she is feeling suprapubic pressure and feels like she is voiding smaller amounts. I encouraged rest and hydration. Will make a plan once results have been received.

## 2021-12-15 ENCOUNTER — Encounter: Payer: Self-pay | Admitting: Obstetrics

## 2021-12-15 LAB — URINE CULTURE

## 2021-12-21 ENCOUNTER — Other Ambulatory Visit: Payer: Self-pay

## 2021-12-21 ENCOUNTER — Ambulatory Visit (INDEPENDENT_AMBULATORY_CARE_PROVIDER_SITE_OTHER): Payer: BC Managed Care – PPO | Admitting: Obstetrics and Gynecology

## 2021-12-21 ENCOUNTER — Encounter: Payer: Self-pay | Admitting: Obstetrics and Gynecology

## 2021-12-21 VITALS — BP 116/75 | Wt 210.3 lb

## 2021-12-21 DIAGNOSIS — Z3483 Encounter for supervision of other normal pregnancy, third trimester: Secondary | ICD-10-CM

## 2021-12-21 DIAGNOSIS — Z3A33 33 weeks gestation of pregnancy: Secondary | ICD-10-CM

## 2021-12-21 LAB — POCT URINALYSIS DIPSTICK OB
Bilirubin, UA: NEGATIVE
Blood, UA: NEGATIVE
Glucose, UA: NEGATIVE
Leukocytes, UA: NEGATIVE
Nitrite, UA: NEGATIVE
POC,PROTEIN,UA: NEGATIVE
Spec Grav, UA: 1.025 (ref 1.010–1.025)
Urobilinogen, UA: 0.2 E.U./dL
pH, UA: 5 (ref 5.0–8.0)

## 2021-12-21 NOTE — Progress Notes (Signed)
ROB: Occasionally has right flank pain but not as bad as when she was admitted.  Urine negative today.  Leopold's -suspect baby breech.  Consider follow-up ultrasound later in pregnancy.  We have discussed VBAC.  As patient was a scheduled breech delivery with last pregnancy she is a good candidate for vaginal birth after cesarean.  We have discussed this in some detail.  Risks of repeat cesarean delivery versus failed vaginal delivery discussed.  Scheduling of repeat cesarean discussed.  All questions answered.  Patient has not yet decided.

## 2021-12-22 ENCOUNTER — Encounter: Payer: Self-pay | Admitting: Obstetrics and Gynecology

## 2021-12-28 ENCOUNTER — Encounter: Payer: Self-pay | Admitting: Obstetrics and Gynecology

## 2021-12-28 ENCOUNTER — Other Ambulatory Visit: Payer: Self-pay

## 2021-12-28 ENCOUNTER — Observation Stay
Admission: EM | Admit: 2021-12-28 | Discharge: 2021-12-29 | Disposition: A | Discharge status: Home / Self Care | Payer: BC Managed Care – PPO | Attending: Obstetrics | Admitting: Obstetrics

## 2021-12-28 DIAGNOSIS — O219 Vomiting of pregnancy, unspecified: Secondary | ICD-10-CM | POA: Diagnosis not present

## 2021-12-28 DIAGNOSIS — R109 Unspecified abdominal pain: Secondary | ICD-10-CM

## 2021-12-28 DIAGNOSIS — O35EXX0 Maternal care for other (suspected) fetal abnormality and damage, fetal genitourinary anomalies, not applicable or unspecified: Secondary | ICD-10-CM | POA: Diagnosis present

## 2021-12-28 DIAGNOSIS — O26893 Other specified pregnancy related conditions, third trimester: Secondary | ICD-10-CM | POA: Diagnosis present

## 2021-12-28 DIAGNOSIS — Z3A34 34 weeks gestation of pregnancy: Secondary | ICD-10-CM | POA: Diagnosis not present

## 2021-12-28 DIAGNOSIS — N132 Hydronephrosis with renal and ureteral calculous obstruction: Secondary | ICD-10-CM | POA: Diagnosis not present

## 2021-12-28 DIAGNOSIS — O2693 Pregnancy related conditions, unspecified, third trimester: Principal | ICD-10-CM | POA: Insufficient documentation

## 2021-12-28 DIAGNOSIS — Z3490 Encounter for supervision of normal pregnancy, unspecified, unspecified trimester: Secondary | ICD-10-CM

## 2021-12-28 LAB — CBC WITH DIFFERENTIAL/PLATELET
Abs Immature Granulocytes: 0.07 10*3/uL (ref 0.00–0.07)
Basophils Absolute: 0 10*3/uL (ref 0.0–0.1)
Basophils Relative: 0 %
Eosinophils Absolute: 0.1 10*3/uL (ref 0.0–0.5)
Eosinophils Relative: 1 %
HCT: 33.6 % — ABNORMAL LOW (ref 36.0–46.0)
Hemoglobin: 11.3 g/dL — ABNORMAL LOW (ref 12.0–15.0)
Immature Granulocytes: 1 %
Lymphocytes Relative: 15 %
Lymphs Abs: 1.9 10*3/uL (ref 0.7–4.0)
MCH: 30.7 pg (ref 26.0–34.0)
MCHC: 33.6 g/dL (ref 30.0–36.0)
MCV: 91.3 fL (ref 80.0–100.0)
Monocytes Absolute: 0.8 10*3/uL (ref 0.1–1.0)
Monocytes Relative: 7 %
Neutro Abs: 9.4 10*3/uL — ABNORMAL HIGH (ref 1.7–7.7)
Neutrophils Relative %: 76 %
Platelets: 236 10*3/uL (ref 150–400)
RBC: 3.68 MIL/uL — ABNORMAL LOW (ref 3.87–5.11)
RDW: 12.7 % (ref 11.5–15.5)
WBC: 12.3 10*3/uL — ABNORMAL HIGH (ref 4.0–10.5)
nRBC: 0 % (ref 0.0–0.2)

## 2021-12-28 LAB — URINALYSIS, ROUTINE W REFLEX MICROSCOPIC
Bilirubin Urine: NEGATIVE
Glucose, UA: NEGATIVE mg/dL
Ketones, ur: 15 mg/dL — AB
Leukocytes,Ua: NEGATIVE
Nitrite: NEGATIVE
Protein, ur: NEGATIVE mg/dL
Specific Gravity, Urine: 1.015 (ref 1.005–1.030)
pH: 7 (ref 5.0–8.0)

## 2021-12-28 LAB — URINALYSIS, MICROSCOPIC (REFLEX)

## 2021-12-28 MED ORDER — ONDANSETRON HCL 4 MG/2ML IJ SOLN
4.0000 mg | Freq: Once | INTRAMUSCULAR | Status: AC
  Administered 2021-12-28: 4 mg via INTRAVENOUS
  Filled 2021-12-28: qty 2

## 2021-12-28 MED ORDER — MORPHINE SULFATE (PF) 2 MG/ML IV SOLN
2.0000 mg | Freq: Once | INTRAVENOUS | Status: AC
  Administered 2021-12-28: 2 mg via INTRAVENOUS
  Filled 2021-12-28: qty 1

## 2021-12-28 NOTE — OB Triage Note (Signed)
Pt is G2P1 prev C/S for breech with c/o L sided flank pain with sudden onset at 2030. Pt states exact same with R side a few weeks ago and she had IV fluid and pain meds. Pt appears uncomfortable and rates pain 10/10. FHR 130 +FM

## 2021-12-29 ENCOUNTER — Observation Stay: Payer: BC Managed Care – PPO

## 2021-12-29 ENCOUNTER — Encounter: Payer: Self-pay | Admitting: Obstetrics

## 2021-12-29 DIAGNOSIS — O2693 Pregnancy related conditions, unspecified, third trimester: Secondary | ICD-10-CM | POA: Diagnosis not present

## 2021-12-29 DIAGNOSIS — R109 Unspecified abdominal pain: Secondary | ICD-10-CM | POA: Diagnosis present

## 2021-12-29 LAB — COMPREHENSIVE METABOLIC PANEL
ALT: 10 U/L (ref 0–44)
AST: 19 U/L (ref 15–41)
Albumin: 2.9 g/dL — ABNORMAL LOW (ref 3.5–5.0)
Alkaline Phosphatase: 128 U/L — ABNORMAL HIGH (ref 38–126)
Anion gap: 10 (ref 5–15)
BUN: 10 mg/dL (ref 6–20)
CO2: 20 mmol/L — ABNORMAL LOW (ref 22–32)
Calcium: 8.6 mg/dL — ABNORMAL LOW (ref 8.9–10.3)
Chloride: 103 mmol/L (ref 98–111)
Creatinine, Ser: 0.46 mg/dL (ref 0.44–1.00)
GFR, Estimated: >60 mL/min (ref >60)
Glucose, Bld: 104 mg/dL — ABNORMAL HIGH (ref 70–99)
Potassium: 3.7 mmol/L (ref 3.5–5.1)
Sodium: 133 mmol/L — ABNORMAL LOW (ref 135–145)
Total Bilirubin: 0.3 mg/dL (ref 0.3–1.2)
Total Protein: 6.4 g/dL — ABNORMAL LOW (ref 6.5–8.1)

## 2021-12-29 MED ORDER — ACETAMINOPHEN 325 MG PO TABS: 650.0000 mg | ORAL_TABLET | ORAL | Status: DC | PRN

## 2021-12-29 MED ORDER — PROMETHAZINE HCL 25 MG/ML IJ SOLN
25.0000 mg | Freq: Four times a day (QID) | INTRAMUSCULAR | Status: DC | PRN
  Filled 2021-12-29: qty 1

## 2021-12-29 MED ORDER — PROMETHAZINE HCL 25 MG/ML IJ SOLN
INTRAMUSCULAR | Status: AC
  Administered 2021-12-29: 01:00:00 25 mg
  Filled 2021-12-29: qty 1

## 2021-12-29 MED ORDER — LACTATED RINGERS IV SOLN: INTRAVENOUS | Status: DC

## 2021-12-29 MED ORDER — OXYCODONE-ACETAMINOPHEN 5-325 MG PO TABS
1.0000 | ORAL_TABLET | Freq: Four times a day (QID) | ORAL | Status: DC | PRN
  Administered 2021-12-29: 07:00:00 2 via ORAL
  Filled 2021-12-29: qty 2

## 2021-12-29 MED ORDER — MORPHINE SULFATE (PF) 2 MG/ML IV SOLN
2.0000 mg | INTRAVENOUS | Status: DC | PRN
  Administered 2021-12-29 (×3): 2 mg via INTRAVENOUS
  Filled 2021-12-29 (×3): qty 1

## 2021-12-29 NOTE — OB Triage Note (Signed)
LABOR & DELIVERY OB TRIAGE NOTE  SUBJECTIVE  HPI Bethany Gutierrez is a 31 y.o. G2P1001 at [redacted]w[redacted]d who presents to Labor & Delivery for acute onset left-sided flank pain.She states that she had been feeling unwell earlier in the day and began feeling significantly worse in the evening around 2000. She describes the pain as a constant pain that started in the left side of her back and wrapping around to the front. She has vomited several times from the pain. She denies hematuria, vaginal bleeding, contractions, and LOF. She endorses mild dysuria. Bowel movements have been normal. She states that the pain is very similar to when she had hydronephrosis a few weeks ago.   OB History     Gravida  2   Para  1   Term  1   Preterm      AB      Living  1      SAB      IAB      Ectopic      Multiple  0   Live Births  1           Scheduled Meds: Continuous Infusions:  lactated ringers 125 mL/hr at 12/29/21 0038   promethazine (PHENERGAN) injection (IM or IVPB)     PRN Meds:.acetaminophen, morphine injection, promethazine (PHENERGAN) injection (IM or IVPB)  OBJECTIVE  BP 125/72    Pulse 80    Temp 98.6 F (37 C) (Oral)    Resp 16    LMP 05/03/2021   Cardiovascular: RRR, no murmur Respiratory: CTAB GI/GU: Gravid. Normal bowel sounds. Tenderness to palpation on left side. +CVA tenderness on left side.  NST I reviewed the NST and it was reactive.  Baseline: 135 Variability: moderate Accelerations: present, 15x15 Decelerations:none Toco: Irregular contractions Category 1  ASSESSMENT  1) Pregnancy at G2P1001, [redacted]w[redacted]d, Estimated Date of Delivery: 02/07/22 2) Reassuring maternal/fetal status 3) Left flank pain - hydronephrosis vs kidney stones 4) Nausea and vomiting  PLAN   Renal US IV hydration Pain and nausea control Observe and reassess after rehydration Consulted with Dr. Barrington Ellison, CNM

## 2021-12-29 NOTE — OB Triage Note (Signed)
LABOR & DELIVERY OB TRIAGE NOTE  SUBJECTIVE  HPI Bethany Gutierrez is a 31 y.o. G2P1001 at [redacted]w[redacted]d who presents to Labor & Delivery for left-sided flank pain. US shows hydronephrosis. Vomiting has improved. Still having pain, but feels she will be able to manage it at home.  OB History     Gravida  2   Para  1   Term  1   Preterm      AB      Living  1      SAB      IAB      Ectopic      Multiple  0   Live Births  1           Scheduled Meds: Continuous Infusions:  lactated ringers 125 mL/hr at 12/29/21 0454   promethazine (PHENERGAN) injection (IM or IVPB)     PRN Meds:.acetaminophen, morphine injection, oxyCODONE-acetaminophen, promethazine (PHENERGAN) injection (IM or IVPB)  OBJECTIVE  BP 125/72    Pulse 80    Temp 98.6 F (37 C) (Oral)    Resp 16    LMP 05/03/2021   NST I reviewed the NST and it was reactive.  Baseline: 140 Variability: moderate Accelerations: present, 15x15 Decelerations:none Toco: irritability Category 1  Fundal height: 35 cm  ASSESSMENT Impression  1) Pregnancy at G2P1001, [redacted]w[redacted]d, Estimated Date of Delivery: 02/07/22 2) Reassuring maternal/fetal status 3) Hydronephrosis 4) Nausea/vomiting - resolved 5) Pain - improving  PLAN  1) Discharge home 2) Rest, hydration, and pain control 3) Still has 2 Percocet and some Zofran at home from last visit. Will use as needed for severe pain. Tylenol for mild pain. 4) Keep appointment scheduled for 1/27 - will change to video visit. 4) Call with worsening symptoms, fever, blood in urine, contractions, LOF, or vaginal bleeding.  Guadlupe Spanish, CNM

## 2021-12-30 LAB — URINE CULTURE: Culture: 10000 — AB

## 2021-12-31 ENCOUNTER — Telehealth (INDEPENDENT_AMBULATORY_CARE_PROVIDER_SITE_OTHER): Payer: BC Managed Care – PPO | Admitting: Obstetrics

## 2021-12-31 VITALS — Ht 67.0 in | Wt 208.0 lb

## 2021-12-31 DIAGNOSIS — O35EXX0 Maternal care for other (suspected) fetal abnormality and damage, fetal genitourinary anomalies, not applicable or unspecified: Secondary | ICD-10-CM

## 2021-12-31 DIAGNOSIS — Z3A34 34 weeks gestation of pregnancy: Secondary | ICD-10-CM

## 2021-12-31 NOTE — Progress Notes (Signed)
ROB: This is a video visit to discuss c-section.

## 2021-12-31 NOTE — Progress Notes (Signed)
Telephone ROB Visit    Spoke with Kirstein on the phone today to follow up after her hospital stay and complete her 34-week ROB. She reports good fetal movement. She denies contractions, LOF, and vaginal bleeding. She is feeling much better and has not had to take any Tylenol or other pain medications since her discharge from the hospital. She is still having some pressure and discomfort, but is coping well. Reviewed when to call or return to the hospital. She has been resting and hydrating. She is interested in scheduling a repeat c/s. Will have an appointment with Dr. Logan Bores next week.   On 12/29/21, fundal height was 35 cm and she had a reactive NST. Her BP was 125/72 and pulse was 80.  Guadlupe Spanish, CNM

## 2022-01-07 ENCOUNTER — Encounter: Payer: BC Managed Care – PPO | Admitting: Obstetrics

## 2022-01-07 ENCOUNTER — Encounter: Payer: Self-pay | Admitting: Obstetrics and Gynecology

## 2022-01-07 ENCOUNTER — Other Ambulatory Visit: Payer: Self-pay

## 2022-01-07 ENCOUNTER — Ambulatory Visit (INDEPENDENT_AMBULATORY_CARE_PROVIDER_SITE_OTHER): Payer: BC Managed Care – PPO | Admitting: Obstetrics and Gynecology

## 2022-01-07 VITALS — BP 107/71 | HR 105 | Wt 212.2 lb

## 2022-01-07 DIAGNOSIS — Z3483 Encounter for supervision of other normal pregnancy, third trimester: Secondary | ICD-10-CM

## 2022-01-07 DIAGNOSIS — Z3A35 35 weeks gestation of pregnancy: Secondary | ICD-10-CM

## 2022-01-07 LAB — POCT URINALYSIS DIPSTICK OB
Bilirubin, UA: NEGATIVE
Glucose, UA: NEGATIVE
Ketones, UA: NEGATIVE
Leukocytes, UA: NEGATIVE
Nitrite, UA: NEGATIVE
POC,PROTEIN,UA: NEGATIVE
Spec Grav, UA: 1.015 (ref 1.010–1.025)
Urobilinogen, UA: 0.2 E.U./dL
pH, UA: 7 (ref 5.0–8.0)

## 2022-01-07 NOTE — Progress Notes (Signed)
ROB. Patient states fetal movement along with low pelvic pressure. Patient states she is wanting to schedule her Caesarian. Patient states no other questions or concerns.

## 2022-01-07 NOTE — Addendum Note (Signed)
Addended by: Elonda Husky on: 01/07/2022 02:56 PM   Modules accepted: Orders

## 2022-01-07 NOTE — Progress Notes (Signed)
ROB: She states that she is "over being pregnant".  She was recently hospitalized for a recurrent episode of ureter dilation likely caused by her pregnancy.  She states that she continues to experience some pain and pressure but it is not as bad as when she was hospitalized.  She has now decided that she would like a repeat cesarean delivery.  Additionally she still has expressed her desire for permanent sterilization.  She and her husband state that they are both in agreement that they have completed childbearing.  She has explained to me that her mother has ovarian cancer and has been receiving treatment for over 7 years.  She would like her tubes completely removed at cesarean delivery if possible.  I have scheduled her cesarean for the 27th.  Cultures next visit.

## 2022-01-14 ENCOUNTER — Ambulatory Visit (INDEPENDENT_AMBULATORY_CARE_PROVIDER_SITE_OTHER): Payer: BC Managed Care – PPO | Admitting: Obstetrics

## 2022-01-14 ENCOUNTER — Other Ambulatory Visit: Payer: Self-pay

## 2022-01-14 VITALS — BP 113/79 | HR 91 | Wt 212.1 lb

## 2022-01-14 DIAGNOSIS — Z3A36 36 weeks gestation of pregnancy: Secondary | ICD-10-CM

## 2022-01-14 DIAGNOSIS — Z3483 Encounter for supervision of other normal pregnancy, third trimester: Secondary | ICD-10-CM

## 2022-01-14 LAB — POCT URINALYSIS DIPSTICK
Bilirubin, UA: NEGATIVE
Blood, UA: NEGATIVE
Glucose, UA: NEGATIVE
Leukocytes, UA: NEGATIVE
Nitrite, UA: NEGATIVE
Protein, UA: NEGATIVE
Spec Grav, UA: 1.025 (ref 1.010–1.025)
Urobilinogen, UA: 0.2 E.U./dL
pH, UA: 6.5 (ref 5.0–8.0)

## 2022-01-14 NOTE — Progress Notes (Signed)
ROB at [redacted]w[redacted]d. Feels well. Active baby. Denies contractions, LOF, and vaginal bleeding. No urinary symptoms or flank pain. Feels prepared for c/s. Baby feels cephalic by Leopold's today. GC/chlamydia and GBS collected. Needs BTL consent. ROB in one week.  Guadlupe Spanish, CNM

## 2022-01-16 LAB — STREP GP B NAA: Strep Gp B NAA: NEGATIVE

## 2022-01-19 ENCOUNTER — Encounter: Payer: Self-pay | Admitting: Certified Nurse Midwife

## 2022-01-19 ENCOUNTER — Other Ambulatory Visit: Payer: Self-pay

## 2022-01-19 ENCOUNTER — Ambulatory Visit (INDEPENDENT_AMBULATORY_CARE_PROVIDER_SITE_OTHER): Payer: BC Managed Care – PPO | Admitting: Certified Nurse Midwife

## 2022-01-19 VITALS — BP 117/79 | HR 109 | Wt 215.5 lb

## 2022-01-19 DIAGNOSIS — Z3483 Encounter for supervision of other normal pregnancy, third trimester: Secondary | ICD-10-CM

## 2022-01-19 DIAGNOSIS — Z3A37 37 weeks gestation of pregnancy: Secondary | ICD-10-CM

## 2022-01-19 LAB — POCT URINALYSIS DIPSTICK OB
Bilirubin, UA: NEGATIVE
Blood, UA: NEGATIVE
Glucose, UA: NEGATIVE
Ketones, UA: NEGATIVE
Leukocytes, UA: NEGATIVE
Nitrite, UA: NEGATIVE
POC,PROTEIN,UA: NEGATIVE
Spec Grav, UA: 1.02 (ref 1.010–1.025)
Urobilinogen, UA: 0.2 E.U./dL
pH, UA: 7.5 (ref 5.0–8.0)

## 2022-01-19 NOTE — Patient Instructions (Signed)
Bethany Gutierrez Contractions Contractions of the uterus can occur throughout pregnancy, but they are not always a sign that you are in labor. You may have practice contractions called Bethany Gutierrez contractions. These false labor contractions are sometimes confused with true labor. What are Bethany Gutierrez contractions? Bethany Gutierrez contractions are tightening movements that occur in the muscles of the uterus before labor. Unlike true labor contractions, these contractions do not result in opening (dilation) and thinning of the lowest part of the uterus (cervix). Toward the end of pregnancy (32-34 weeks), Bethany Gutierrez contractions can happen more often and may become stronger. These contractions are sometimes difficult to tell apart from true labor because they can be very uncomfortable. How to tell the difference between true labor and false labor True labor Contractions last 30-70 seconds. Contractions become very regular. Discomfort is usually felt in the top of the uterus, and it spreads to the lower abdomen and low back. Contractions do not go away with walking. Contractions usually become stronger and more frequent. The cervix dilates and gets thinner. False labor Contractions are usually shorter, weaker, and farther apart than true labor contractions. Contractions are usually irregular. Contractions are often felt in the front of the lower abdomen and in the groin. Contractions may go away when you walk around or change positions while lying down. The cervix usually does not dilate or become thin. Sometimes, the only way to tell if you are in true labor is for your health care provider to look for changes in your cervix. Your health care provider will do a physical exam and may monitor your contractions. If you are in true labor, your health care provider will send you home with instructions about when to return to the hospital. You may continue to have Bethany Gutierrez contractions until you  go into true labor. Follow these instructions at home:  Take over-the-counter and prescription medicines only as told by your health care provider. If Bethany Gutierrez contractions are making you uncomfortable: Change your position from lying down or resting to walking, or change from walking to resting. Sit and rest in a tub of warm water. Drink enough fluid to keep your urine pale yellow. Dehydration may cause these contractions. Do slow and deep breathing several times an hour. Keep all follow-up visits. This is important. Contact a health care provider if: You have a fever. You have continuous pain in your abdomen. Your contractions become stronger, more regular, and closer together. You pass blood-tinged mucus. Get help right away if: You have fluid leaking or gushing from your vagina. You have bright red blood coming from your vagina. Your baby is not moving inside you as much as it used to. Summary You may have practice contractions called Bethany Gutierrez contractions. These false labor contractions are sometimes confused with true labor. Bethany Gutierrez contractions are usually shorter, weaker, farther apart, and less regular than true labor contractions. True labor contractions usually become stronger, more regular, and more frequent. Manage discomfort from Bethany Gutierrez contractions by changing position, resting in a warm bath, practicing deep breathing, and drinking plenty of water. Keep all follow-up visits. Contact your health care provider if your contractions become stronger, more regular, and closer together. This information is not intended to replace advice given to you by your health care provider. Make sure you discuss any questions you have with your health care provider. Document Revised: 09/28/2020 Document Reviewed: 09/28/2020 Elsevier Patient Education  2022 [employer]  

## 2022-01-19 NOTE — Progress Notes (Signed)
ROB doing well , Feeling good movement. Has repeat c-sections scheduled on 2/27. Discussed that no CNM on call that day. She verbalizes understanding and is not upset by this. BTL consent reviewed and signed by pt. Copy scanned to chart, Pt also given copy. Follow up 1 wk ROB with Missy.   Doreene Burke, CNM

## 2022-01-20 LAB — GC/CHLAMYDIA PROBE AMP
Chlamydia trachomatis, NAA: NEGATIVE
Neisseria Gonorrhoeae by PCR: NEGATIVE

## 2022-01-26 ENCOUNTER — Ambulatory Visit (INDEPENDENT_AMBULATORY_CARE_PROVIDER_SITE_OTHER): Payer: BC Managed Care – PPO | Admitting: Obstetrics

## 2022-01-26 ENCOUNTER — Other Ambulatory Visit: Payer: Self-pay

## 2022-01-26 VITALS — BP 120/84 | Wt 217.0 lb

## 2022-01-26 DIAGNOSIS — Z3483 Encounter for supervision of other normal pregnancy, third trimester: Secondary | ICD-10-CM

## 2022-01-26 NOTE — Progress Notes (Signed)
ROB at 38w2. Lots of fetal movement. +cramping, denies ctx, LOF, and vaginal bleeding. No more kidney symptoms. Ready for birth. Discussed comfort measures for swelling in feet. RCS, BTL scheduled for Monday, 01/31/22. If she labors before, she may want to try for a vaginal birth. Reviewed when to call. RTC 2 week PP.  Lloyd Huger, CNM

## 2022-01-26 NOTE — Progress Notes (Signed)
No vb. No lof.  

## 2022-01-27 ENCOUNTER — Other Ambulatory Visit
Admission: RE | Admit: 2022-01-27 | Discharge: 2022-01-27 | Disposition: A | Discharge status: Home / Self Care | Payer: BC Managed Care – PPO | Source: Ambulatory Visit | Attending: Obstetrics and Gynecology | Admitting: Obstetrics and Gynecology

## 2022-01-27 ENCOUNTER — Other Ambulatory Visit: Payer: Self-pay

## 2022-01-27 HISTORY — DX: Personal history of urinary calculi: Z87.442

## 2022-01-27 HISTORY — DX: Pneumonia, unspecified organism: J18.9

## 2022-01-27 MED ORDER — FAMOTIDINE 20 MG PO TABS
20.0000 mg | ORAL_TABLET | Freq: Once | ORAL | Status: DC
Start: 2022-01-27 — End: 2022-01-28
  Filled 2022-01-27: qty 1

## 2022-01-27 MED ORDER — FAMOTIDINE 20 MG PO TABS: 20.0000 mg | ORAL_TABLET | Freq: Once | ORAL | Status: DC

## 2022-01-27 NOTE — Patient Instructions (Addendum)
Your procedure is scheduled on: 01/31/22 - Monday Report to the 1st floor of the [facility] at 7:45 am . Report to [facility] for Covid testing and Labs at 8 am  on 01/28/22.  REMEMBER: Instructions that are not followed completely may result in serious medical risk, up to and including death; or upon the discretion of your surgeon and anesthesiologist your surgery may need to be rescheduled.  Do not eat food after midnight the night before surgery.  No gum chewing, lozengers or hard candies.  You may however, drink CLEAR liquids up to 2 hours before you are scheduled to arrive for your surgery. Do not drink anything within 2 hours of your scheduled arrival time.  Clear liquids include: - water  - apple juice without pulp - gatorade (not RED colors) - black coffee or tea (Do NOT add milk or creamers to the coffee or tea) Do NOT drink anything that is not on this list.  TAKE THESE MEDICATIONS THE MORNING OF SURGERY WITH A SIP OF WATER: NONE  One week prior to surgery: Stop Anti-inflammatories (NSAIDS) such as Advil, Aleve, Ibuprofen, Motrin, Naproxen, Naprosyn and Aspirin based products such as Excedrin, Goodys Powder, BC Powder.  Stop ANY OVER THE COUNTER supplements until after surgery.  You may take Tylenol if needed for pain up until the day of surgery.  No Alcohol for 24 hours before or after surgery.  No Smoking including e-cigarettes for 24 hours prior to surgery.  No chewable tobacco products for at least 6 hours prior to surgery.  No nicotine patches on the day of surgery.  Do not use any "recreational" drugs for at least a week prior to your surgery.  Please be advised that the combination of cocaine and anesthesia may have negative outcomes, up to and including death. If you test positive for cocaine, your surgery will be cancelled.  On the morning of surgery brush your teeth with toothpaste and water, you may rinse your mouth with mouthwash if you wish. Do  not swallow any toothpaste or mouthwash.  Use CHG Soap or wipes as directed on instruction sheet.  Do not wear jewelry, make-up, hairpins, clips or nail polish.  Do not wear lotions, powders, or perfumes.   Do not shave body from the neck down 48 hours prior to surgery just in case you cut yourself which could leave a site for infection.  Also, freshly shaved skin may become irritated if using the CHG soap.  Contact lenses, hearing aids and dentures may not be worn into surgery.  Do not bring valuables to the hospital. [facility] is not responsible for any missing/lost belongings or valuables.   Notify your doctor if there is any change in your medical condition (cold, fever, infection).  Wear comfortable clothing (specific to your surgery type) to the hospital.  After surgery, you can help prevent lung complications by doing breathing exercises.  Take deep breaths and cough every 1-2 hours. Your doctor may order a device called an Incentive Spirometer to help you take deep breaths. When coughing or sneezing, hold a pillow firmly against your incision with both hands. This is called splinting. Doing this helps protect your incision. It also decreases belly discomfort.  If you are being admitted to the hospital overnight, leave your suitcase in the car. After surgery it may be brought to your room.  If you are being discharged the day of surgery, you will not be allowed to drive home. You will need a  responsible adult (18 years or older) to drive you home and stay with you that night.   If you are taking public transportation, you will need to have a responsible adult (18 years or older) with you. Please confirm with your physician that it is acceptable to use public transportation.   Please call the Pre-admissions Testing Dept. at [phone] if you have any questions about these instructions.  Surgery Visitation Policy:   Patients undergoing a surgery or procedure may have  one family member or support person with them as long as that person is not COVID-19 positive or experiencing its symptoms.   Inpatient Visitation:    Visiting hours are 7 a.m. to 8 p.m. Up to two visitors ages 16+ are allowed at one time in a patient room. The visitors may rotate out with other people during the day. Visitors must check out when they leave, or other visitors will not be allowed. One designated support person may remain overnight. The visitor must pass COVID-19 screenings, use hand sanitizer when entering and exiting the patients room and wear a mask at all times, including in the patients room. Patients must also wear a mask when staff or their visitor are in the room. Masking is required regardless of vaccination status.

## 2022-01-28 ENCOUNTER — Encounter
Admission: RE | Admit: 2022-01-28 | Discharge: 2022-01-28 | Disposition: A | Discharge status: Home / Self Care | Payer: BC Managed Care – PPO | Source: Ambulatory Visit | Attending: Obstetrics and Gynecology | Admitting: Obstetrics and Gynecology

## 2022-01-28 DIAGNOSIS — Z3A35 35 weeks gestation of pregnancy: Secondary | ICD-10-CM

## 2022-01-28 DIAGNOSIS — Z20822 Contact with and (suspected) exposure to covid-19: Secondary | ICD-10-CM | POA: Insufficient documentation

## 2022-01-28 DIAGNOSIS — Z01812 Encounter for preprocedural laboratory examination: Secondary | ICD-10-CM | POA: Insufficient documentation

## 2022-01-28 LAB — SARS CORONAVIRUS 2 (TAT 6-24 HRS): SARS Coronavirus 2: NEGATIVE

## 2022-01-28 LAB — CBC
HCT: 33 % — ABNORMAL LOW (ref 36.0–46.0)
Hemoglobin: 10.8 g/dL — ABNORMAL LOW (ref 12.0–15.0)
MCH: 29.2 pg (ref 26.0–34.0)
MCHC: 32.7 g/dL (ref 30.0–36.0)
MCV: 89.2 fL (ref 80.0–100.0)
Platelets: 192 10*3/uL (ref 150–400)
RBC: 3.7 MIL/uL — ABNORMAL LOW (ref 3.87–5.11)
RDW: 13.2 % (ref 11.5–15.5)
WBC: 8 10*3/uL (ref 4.0–10.5)
nRBC: 0 % (ref 0.0–0.2)

## 2022-01-28 LAB — TYPE AND SCREEN
ABO/RH(D): O POS
Antibody Screen: NEGATIVE
Extend sample reason: UNDETERMINED

## 2022-01-31 ENCOUNTER — Encounter
Admission: RE | Disposition: A | Discharge status: Home / Self Care | Payer: Self-pay | Source: Home / Self Care | Attending: Obstetrics and Gynecology

## 2022-01-31 ENCOUNTER — Inpatient Hospital Stay: Payer: BC Managed Care – PPO | Admitting: Urgent Care

## 2022-01-31 ENCOUNTER — Other Ambulatory Visit: Payer: Self-pay

## 2022-01-31 ENCOUNTER — Inpatient Hospital Stay
Admission: RE | Admit: 2022-01-31 | Discharge: 2022-02-02 | DRG: 785 | Disposition: A | Discharge status: Home / Self Care | Payer: BC Managed Care – PPO | Attending: Obstetrics and Gynecology | Admitting: Obstetrics and Gynecology

## 2022-01-31 ENCOUNTER — Inpatient Hospital Stay: Payer: BC Managed Care – PPO | Admitting: Certified Registered Nurse Anesthetist

## 2022-01-31 ENCOUNTER — Encounter: Payer: Self-pay | Admitting: Obstetrics and Gynecology

## 2022-01-31 DIAGNOSIS — Z3A39 39 weeks gestation of pregnancy: Secondary | ICD-10-CM | POA: Diagnosis not present

## 2022-01-31 DIAGNOSIS — O34211 Maternal care for low transverse scar from previous cesarean delivery: Secondary | ICD-10-CM | POA: Diagnosis present

## 2022-01-31 DIAGNOSIS — Z3009 Encounter for other general counseling and advice on contraception: Secondary | ICD-10-CM

## 2022-01-31 DIAGNOSIS — Z302 Encounter for sterilization: Secondary | ICD-10-CM | POA: Diagnosis not present

## 2022-01-31 DIAGNOSIS — Z9889 Other specified postprocedural states: Secondary | ICD-10-CM

## 2022-01-31 DIAGNOSIS — O35EXX0 Maternal care for other (suspected) fetal abnormality and damage, fetal genitourinary anomalies, not applicable or unspecified: Secondary | ICD-10-CM

## 2022-01-31 DIAGNOSIS — Z20822 Contact with and (suspected) exposure to covid-19: Secondary | ICD-10-CM | POA: Diagnosis present

## 2022-01-31 DIAGNOSIS — Z3490 Encounter for supervision of normal pregnancy, unspecified, unspecified trimester: Secondary | ICD-10-CM

## 2022-01-31 HISTORY — DX: Encounter for supervision of normal pregnancy, unspecified, unspecified trimester: Z34.90

## 2022-01-31 HISTORY — DX: Other specified postprocedural states: Z98.890

## 2022-01-31 LAB — RAPID HIV SCREEN (HIV 1/2 AB+AG)
HIV 1/2 Antibodies: NONREACTIVE
HIV-1 P24 Antigen - HIV24: NONREACTIVE

## 2022-01-31 SURGERY — Surgical Case
Anesthesia: Spinal

## 2022-01-31 MED ORDER — PHENYLEPHRINE HCL-NACL 20-0.9 MG/250ML-% IV SOLN
INTRAVENOUS | Status: DC | PRN
  Administered 2022-01-31: 40 ug/min via INTRAVENOUS

## 2022-01-31 MED ORDER — OXYCODONE-ACETAMINOPHEN 5-325 MG PO TABS
1.0000 | ORAL_TABLET | ORAL | Status: DC | PRN
  Administered 2022-02-01 – 2022-02-02 (×3): 1 via ORAL
  Filled 2022-01-31 (×3): qty 1

## 2022-01-31 MED ORDER — SIMETHICONE 80 MG PO CHEW
80.0000 mg | CHEWABLE_TABLET | Freq: Four times a day (QID) | ORAL | Status: DC
  Administered 2022-01-31 – 2022-02-02 (×6): 80 mg via ORAL
  Filled 2022-01-31 (×6): qty 1

## 2022-01-31 MED ORDER — DIPHENHYDRAMINE HCL 50 MG/ML IJ SOLN: 12.5000 mg | INTRAMUSCULAR | Status: DC | PRN

## 2022-01-31 MED ORDER — NALOXONE HCL 4 MG/10ML IJ SOLN
1.0000 ug/kg/h | INTRAVENOUS | Status: DC | PRN
  Filled 2022-01-31: qty 5

## 2022-01-31 MED ORDER — SENNOSIDES-DOCUSATE SODIUM 8.6-50 MG PO TABS
2.0000 | ORAL_TABLET | ORAL | Status: DC
  Administered 2022-02-01: 2 via ORAL
  Filled 2022-01-31: qty 2

## 2022-01-31 MED ORDER — MORPHINE SULFATE (PF) 0.5 MG/ML IJ SOLN
INTRAMUSCULAR | Status: AC
  Filled 2022-01-31: qty 10

## 2022-01-31 MED ORDER — DIPHENHYDRAMINE HCL 50 MG/ML IJ SOLN
INTRAMUSCULAR | Status: DC | PRN
  Administered 2022-01-31: 6.25 mg via INTRAVENOUS

## 2022-01-31 MED ORDER — MENTHOL 3 MG MT LOZG
1.0000 | LOZENGE | OROMUCOSAL | Status: DC | PRN
  Filled 2022-01-31: qty 9

## 2022-01-31 MED ORDER — NALOXONE HCL 0.4 MG/ML IJ SOLN: 0.4000 mg | INTRAMUSCULAR | Status: DC | PRN

## 2022-01-31 MED ORDER — POVIDONE-IODINE 10 % EX SWAB
2.0000 "application " | Freq: Once | CUTANEOUS | Status: AC
  Administered 2022-01-31: 2 via TOPICAL

## 2022-01-31 MED ORDER — CEFAZOLIN SODIUM-DEXTROSE 2-4 GM/100ML-% IV SOLN
2.0000 g | INTRAVENOUS | Status: AC
  Administered 2022-01-31: 2 g via INTRAVENOUS
  Filled 2022-01-31: qty 100

## 2022-01-31 MED ORDER — LACTATED RINGERS IV SOLN: Freq: Once | INTRAVENOUS | Status: AC

## 2022-01-31 MED ORDER — LACTATED RINGERS IV SOLN: INTRAVENOUS | Status: DC

## 2022-01-31 MED ORDER — MORPHINE SULFATE (PF) 0.5 MG/ML IJ SOLN
INTRAMUSCULAR | Status: DC | PRN
  Administered 2022-01-31: 100 ug via EPIDURAL

## 2022-01-31 MED ORDER — LIDOCAINE HCL (PF) 1 % IJ SOLN
INTRAMUSCULAR | Status: DC | PRN
  Administered 2022-01-31: 3 mL

## 2022-01-31 MED ORDER — SCOPOLAMINE 1 MG/3DAYS TD PT72
1.0000 | MEDICATED_PATCH | Freq: Once | TRANSDERMAL | Status: DC
  Administered 2022-01-31: 1.5 mg via TRANSDERMAL
  Filled 2022-01-31: qty 1

## 2022-01-31 MED ORDER — FENTANYL CITRATE (PF) 100 MCG/2ML IJ SOLN
INTRAMUSCULAR | Status: AC
  Filled 2022-01-31: qty 2

## 2022-01-31 MED ORDER — ZOLPIDEM TARTRATE 5 MG PO TABS: 5.0000 mg | ORAL_TABLET | Freq: Every evening | ORAL | Status: DC | PRN

## 2022-01-31 MED ORDER — ONDANSETRON HCL 4 MG/2ML IJ SOLN: 4.0000 mg | Freq: Three times a day (TID) | INTRAMUSCULAR | Status: DC | PRN

## 2022-01-31 MED ORDER — DIPHENHYDRAMINE HCL 50 MG/ML IJ SOLN
INTRAMUSCULAR | Status: AC
  Filled 2022-01-31: qty 1

## 2022-01-31 MED ORDER — KETOROLAC TROMETHAMINE 30 MG/ML IJ SOLN: 30.0000 mg | Freq: Four times a day (QID) | INTRAMUSCULAR | Status: DC | PRN

## 2022-01-31 MED ORDER — IBUPROFEN 600 MG PO TABS
600.0000 mg | ORAL_TABLET | Freq: Four times a day (QID) | ORAL | Status: DC
  Administered 2022-01-31 – 2022-02-02 (×7): 600 mg via ORAL
  Filled 2022-01-31 (×7): qty 1

## 2022-01-31 MED ORDER — PRENATAL MULTIVITAMIN CH
1.0000 | ORAL_TABLET | Freq: Every day | ORAL | Status: DC
  Administered 2022-02-01 – 2022-02-02 (×2): 1 via ORAL
  Filled 2022-01-31 (×3): qty 1

## 2022-01-31 MED ORDER — ORAL CARE MOUTH RINSE: 15.0000 mL | Freq: Once | OROMUCOSAL | Status: AC

## 2022-01-31 MED ORDER — CHLORHEXIDINE GLUCONATE 0.12 % MT SOLN
15.0000 mL | Freq: Once | OROMUCOSAL | Status: AC
  Administered 2022-01-31: 15 mL via OROMUCOSAL
  Filled 2022-01-31: qty 15

## 2022-01-31 MED ORDER — BUPIVACAINE IN DEXTROSE 0.75-8.25 % IT SOLN
INTRATHECAL | Status: DC | PRN
  Administered 2022-01-31: 1.6 mL via INTRATHECAL

## 2022-01-31 MED ORDER — ONDANSETRON HCL 4 MG/2ML IJ SOLN
INTRAMUSCULAR | Status: DC | PRN
Start: 2022-01-31 — End: 2022-01-31
  Administered 2022-01-31: 4 mg via INTRAVENOUS

## 2022-01-31 MED ORDER — DIPHENHYDRAMINE HCL 25 MG PO CAPS: 25.0000 mg | ORAL_CAPSULE | Freq: Four times a day (QID) | ORAL | Status: DC | PRN

## 2022-01-31 MED ORDER — SODIUM CHLORIDE 0.9% FLUSH: 3.0000 mL | INTRAVENOUS | Status: DC | PRN

## 2022-01-31 MED ORDER — OXYTOCIN-SODIUM CHLORIDE 30-0.9 UT/500ML-% IV SOLN
INTRAVENOUS | Status: DC | PRN
  Administered 2022-01-31: 500 mL via INTRAVENOUS

## 2022-01-31 MED ORDER — LIDOCAINE 5 % EX PTCH
MEDICATED_PATCH | CUTANEOUS | Status: DC | PRN
  Administered 2022-01-31: 1 via TRANSDERMAL

## 2022-01-31 MED ORDER — KETOROLAC TROMETHAMINE 30 MG/ML IJ SOLN
INTRAMUSCULAR | Status: AC
  Filled 2022-01-31: qty 2

## 2022-01-31 MED ORDER — LIDOCAINE 5 % EX PTCH
MEDICATED_PATCH | CUTANEOUS | Status: AC
  Filled 2022-01-31: qty 1

## 2022-01-31 MED ORDER — SOD CITRATE-CITRIC ACID 500-334 MG/5ML PO SOLN
ORAL | Status: AC
  Administered 2022-01-31: 30 mL
  Filled 2022-01-31: qty 15

## 2022-01-31 MED ORDER — ACETAMINOPHEN 500 MG PO TABS
1000.0000 mg | ORAL_TABLET | Freq: Four times a day (QID) | ORAL | Status: AC
  Administered 2022-01-31 – 2022-02-01 (×4): 1000 mg via ORAL
  Filled 2022-01-31 (×4): qty 2

## 2022-01-31 MED ORDER — KETOROLAC TROMETHAMINE 30 MG/ML IJ SOLN
INTRAMUSCULAR | Status: DC | PRN
Start: 2022-01-31 — End: 2022-01-31
  Administered 2022-01-31: 30 mg via INTRAVENOUS

## 2022-01-31 MED ORDER — PHENYLEPHRINE 40 MCG/ML (10ML) SYRINGE FOR IV PUSH (FOR BLOOD PRESSURE SUPPORT)
PREFILLED_SYRINGE | INTRAVENOUS | Status: DC | PRN
  Administered 2022-01-31: 80 ug via INTRAVENOUS

## 2022-01-31 MED ORDER — DIPHENHYDRAMINE HCL 25 MG PO CAPS: 25.0000 mg | ORAL_CAPSULE | ORAL | Status: DC | PRN

## 2022-01-31 MED ORDER — OXYCODONE HCL 5 MG PO TABS
5.0000 mg | ORAL_TABLET | Freq: Four times a day (QID) | ORAL | Status: DC | PRN
  Administered 2022-01-31 – 2022-02-01 (×3): 5 mg via ORAL
  Filled 2022-01-31 (×3): qty 1

## 2022-01-31 MED ORDER — FENTANYL CITRATE (PF) 100 MCG/2ML IJ SOLN
INTRAMUSCULAR | Status: DC | PRN
Start: 2022-01-31 — End: 2022-01-31
  Administered 2022-01-31: 15 ug via INTRATHECAL

## 2022-01-31 MED ORDER — OXYTOCIN-SODIUM CHLORIDE 30-0.9 UT/500ML-% IV SOLN
INTRAVENOUS | Status: AC
Start: 2022-01-31 — End: 2022-01-31
  Administered 2022-01-31: 2.5 [IU]/h via INTRAVENOUS
  Filled 2022-01-31: qty 1000

## 2022-01-31 MED ORDER — OXYTOCIN-SODIUM CHLORIDE 30-0.9 UT/500ML-% IV SOLN
2.5000 [IU]/h | INTRAVENOUS | Status: DC
  Administered 2022-01-31: 2.5 [IU]/h via INTRAVENOUS

## 2022-01-31 SURGICAL SUPPLY — 31 items
ADHESIVE MASTISOL STRL (MISCELLANEOUS) ×2 IMPLANT
BACTOSHIELD CHG 4% 4OZ (MISCELLANEOUS) ×1
BAG COUNTER SPONGE SURGICOUNT (BAG) ×2 IMPLANT
CHLORAPREP W/TINT 26 (MISCELLANEOUS) ×4 IMPLANT
CLIP FILSHIE TUBAL LIGA STRL (Clip) ×2 IMPLANT
CLOSURE STERI STRIP 1/2 X4 (GAUZE/BANDAGES/DRESSINGS) ×1 IMPLANT
DRSG CURAFIL 4X4 STRL (GAUZE/BANDAGES/DRESSINGS) IMPLANT
DRSG TELFA 3X8 NADH (GAUZE/BANDAGES/DRESSINGS) ×2 IMPLANT
GAUZE CURAFIL 4X4 (GAUZE/BANDAGES/DRESSINGS) IMPLANT
GAUZE SPONGE 4X4 12PLY STRL (GAUZE/BANDAGES/DRESSINGS) ×2 IMPLANT
GLOVE SURG POLY ORTHO LF SZ7.5 (GLOVE) ×2 IMPLANT
GOWN STRL REUS W/ TWL LRG LVL3 (GOWN DISPOSABLE) ×2 IMPLANT
GOWN STRL REUS W/TWL LRG LVL3 (GOWN DISPOSABLE) ×2
KIT TURNOVER KIT A (KITS) ×2 IMPLANT
MANIFOLD NEPTUNE II (INSTRUMENTS) ×2 IMPLANT
MAT PREVALON FULL STRYKER (MISCELLANEOUS) ×2 IMPLANT
NS IRRIG 1000ML POUR BTL (IV SOLUTION) ×2 IMPLANT
PACK C SECTION AR (MISCELLANEOUS) ×2 IMPLANT
PAD DRESSING TELFA 3X8 NADH (GAUZE/BANDAGES/DRESSINGS) ×1 IMPLANT
PAD OB MATERNITY 4.3X12.25 (PERSONAL CARE ITEMS) ×2 IMPLANT
PAD PREP 24X41 OB/GYN DISP (PERSONAL CARE ITEMS) ×2 IMPLANT
PENCIL SMOKE EVACUATOR (MISCELLANEOUS) ×2 IMPLANT
RETRACTOR WND ALEXIS-O 25 LRG (MISCELLANEOUS) ×1 IMPLANT
RTRCTR WOUND ALEXIS O 25CM LRG (MISCELLANEOUS) ×2
SCRUB CHG 4% DYNA-HEX 4OZ (MISCELLANEOUS) ×1 IMPLANT
SPONGE T-LAP 18X18 ~~LOC~~+RFID (SPONGE) ×2 IMPLANT
SUT VIC AB 0 CTX 36 (SUTURE) ×2
SUT VIC AB 0 CTX36XBRD ANBCTRL (SUTURE) ×2 IMPLANT
SUT VIC AB 1 CT1 36 (SUTURE) ×4 IMPLANT
SUT VICRYL+ 3-0 36IN CT-1 (SUTURE) ×4 IMPLANT
WATER STERILE IRR 500ML POUR (IV SOLUTION) ×2 IMPLANT

## 2022-01-31 NOTE — Progress Notes (Signed)
Pt arrived for scheduled C/S with support person.

## 2022-01-31 NOTE — Op Note (Addendum)
° ° °    OP NOTE  Date: 01/31/2022   11:12 AM Name Bethany Gutierrez MR# [id number]  Preoperative Diagnosis: 1. Intrauterine pregnancy at [redacted]w[redacted]d 2. Desires Permanent Sterilization 3. Hx of cesarean delivery for breach   Postoperative Diagnosis: 1. Intrauterine pregnancy at [redacted]w[redacted]d, delivered 2. Desires Permanent Sterilization 3. Viable infant 4. Remainder same as pre-op  Procedure: 1. Repeat Low-Transverse Cesarean Section 2. Bilateral Tubal Occlusion (Filshie)  Surgeon: Finis Bud, MD  Assistant:  Marcelline Mates MD   No other capable assistant was available for this surgery which requires an experienced, high level assistant.    Anesthesia: Spinal   EBL: 550 ml    Findings: 1) female infant, Apgar scores of 8   at 1 minute and 9   at 5 minutes and a birthweight of 158.38  ounces.    2) Normal uterus, tubes and ovaries.   Procedure:   The patient was prepped and draped in the supine position and placed under spinal anesthesia.  A transverse incision was made across the abdomen in a Pfannenstiel manner. If indicated the old scar was systematically removed with sharp dissection.  We carried the dissection down to the level of the fascia.  The fascia was incised in a curvilinear manner.  The fascia was then elevated from the rectus muscles with blunt and sharp dissection.  The rectus muscles were separated laterally exposing the peritoneum.  The peritoneum was carefully entered with care being taken to avoid bowel and bladder.  A self-retaining retractor was placed.  The visceral peritoneum was incised in a curvilinear fashion across the lower uterine segment creating a bladder flap. A transverse incision was made across the lower uterine segment and extended laterally and superiorly using the bandage scissors.  Artificial rupture membranes was performed and Clear fluid was noted.  The infant was delivered from the cephalic position.  A nuchal cord was not present. The cord was doubly clamped and  cut. Cord blood was obtained if appropriate.  The infant was handed to the pediatric personnel  who then placed the infant under heat lamps where it was cleaned dried and re-suctioned. The placenta was delivered. The hysterotomy incision was then identified on ring forceps.  The uterine cavity was cleaned with a moist lap sponge.  The hysterotomy incision was closed with a running interlocking suture of Vicryl.  Hemostasis was excellent.  Pitocin was run in the IV and the uterus was found to be firm. The fallopian tubes were identified  and followed out to their fine fimbriated ends and then back to the mid-portion of the tube which was elevated on a babcock clamp.  Both tubes were completely occluded using Filshie clips across the tube in a perpendicular manner.  Hemostasis was noted. The posterior cul-de-sac and gutters were cleaned and inspected.  Hemostasis was noted.  The fascia was then closed with a running suture of #1 Vicryl.  Hemostasis of the subcutaneous tissues was obtained using the Bovie.  The subcutaneous tissues were closed with a running suture of 000 Vicryl.  A subcuticular suture was placed.  Steri-Strips were applied in the usual manner.  A pressure dressing was placed.  The patient went to the recovery room in stable condition. During the procedure, Dr. Marcelline Mates provided exposure, dissection, suctioning, retraction, and general support and assistance.   Finis Bud, M.D. 01/31/2022 11:12 AM

## 2022-01-31 NOTE — Transfer of Care (Signed)
Immediate Anesthesia Transfer of Care Note  Patient: Bethany Gutierrez  Procedure(s) Performed: Repeat Cesarean Delivery with Sterilization using Filshie Clips  Patient Location: PACU and Mother/Baby  Anesthesia Type:Spinal  Level of Consciousness: awake, alert  and oriented  Airway & Oxygen Therapy: Patient Spontanous Breathing  Post-op Assessment: Report given to RN and Post -op Vital signs reviewed and stable  Post vital signs: Reviewed and stable  Last Vitals:  Vitals Value Taken Time  BP 116/87   Temp    Pulse 75   Resp 12   SpO2 98     Last Pain:  Vitals:   01/31/22 0800  TempSrc: Oral  PainSc: 0-No pain         Complications: No notable events documented.

## 2022-01-31 NOTE — Lactation Note (Signed)
This note was copied from a baby's chart. Lactation Consultation Note  Patient Name: Bethany Gutierrez [id number] Date: 01/31/2022 Reason for consult: L&D Initial assessment;Term;Other (Comment) (LGA; repeat c-section) Age:31 years  Lactation called to assist with first feeding post c-section delivery due to LGA status of baby (9lb14oz).  Maternal Data Has patient been taught Hand Expression?: Yes Does the patient have breastfeeding experience prior to this delivery?: Yes How long did the patient breastfeed?: 6 months Mom reports having to use nipple shield due to previous inverted nipples. Nipples bilaterally appear everted upon initial exam; no nipple shield used.  Feeding Mother's Current Feeding Choice: Breast Milk  LATCH Score Latch: Repeated attempts needed to sustain latch, nipple held in mouth throughout feeding, stimulation needed to elicit sucking reflex.  Audible Swallowing: A few with stimulation  Type of Nipple: Everted at rest and after stimulation  Comfort (Breast/Nipple): Soft / non-tender  Hold (Positioning): Assistance needed to correctly position infant at breast and maintain latch.  LATCH Score: 7  Baby was on/off the breast at first. Able to hand express colostrum for encouragement. Baby finally latched and remained at the breast. Audible swallows heard while doing stimulation/breast compression. LC at bedside for 9 minutes of feeding; parents instructed to note time baby stops feeding for glucose check to be done post feed.  Lactation Tools Discussed/Used    Interventions Interventions: Breast feeding basics reviewed;Assisted with latch;Hand express;Breast compression;Support pillows;Position options;Education  Reviewed feeding plan to be determined based upon sugar levels/pathway. Parents understand. Reviewed early cues, feeding on demand, normal behavior first 24 hours, hand expression/spoon feeding, and output expectations.  Discharge    Consult  Status Consult Status: Follow-up from L&D    Danford Bad 01/31/2022, 11:52 AM

## 2022-01-31 NOTE — H&P (Signed)
History and Physical   HPI  Bethany Gutierrez is a 31 y.o. G2P1001 at [redacted]w[redacted]d Estimated Date of Delivery: 02/07/22 who is being admitted for repeat cesarean delivery.  She also desires permanent sterilization.   OB History  OB History  Gravida Para Term Preterm AB Living  2 1 1  0 0 1  SAB IAB Ectopic Multiple Live Births  0 0 0 0 1    # Outcome Date GA Lbr Len/2nd Weight Sex Delivery Anes PTL Lv  2 Current           1 Term 02/22/19 [redacted]w[redacted]d  3010 g F CS-LTranv Spinal  LIV     Name: MELLISSIA, LONGHENRY     Apgar1: 8  Apgar5: 9    PROBLEM LIST  Pregnancy complications or risks: Patient Active Problem List   Diagnosis Date Noted   Left flank pain 12/29/2021   Prenatal hydronephrosis during pregnancy in third trimester, antepartum 12/10/2021   H/O cesarean section 03/01/2019    Prenatal labs and studies: ABO, Rh: --/--/O POS (02/24 [id number]) Antibody: NEG (02/24 [id number]) Rubella: 2.63 (08/12 1051) RPR: Non Reactive (12/09 1006)  HBsAg: Negative (08/12 1051)  HIV:    [id number]-- (02/10 1541)   Past Medical History:  Diagnosis Date   Asthma    Environmental and seasonal allergies    Family history of adverse reaction to anesthesia    mother and sister post op nausea and vomiting   GERD (gastroesophageal reflux disease)    History of kidney stones    Pneumonia    covid related   Seasonal allergies      Past Surgical History:  Procedure Laterality Date   CESAREAN SECTION N/A 02/22/2019   Procedure: CESAREAN SECTION;  Surgeon: Harlin Heys, MD;  Location: [facility] ORS;  Service: Obstetrics;  Laterality: N/A;   TONSILLECTOMY     WISDOM TOOTH EXTRACTION       Medications    Current Discharge Medication List     CONTINUE these medications which have NOT CHANGED   Details  calcium carbonate (TUMS EX) 750 MG chewable tablet Chew 1 tablet by mouth daily as needed for heartburn.    famotidine-calcium carbonate-magnesium hydroxide (PEPCID COMPLETE) 10-800-165 MG  chewable tablet Chew 1 tablet by mouth daily as needed (Heartburn).    Pediatric Multivitamins-Iron (FLINTSTONES COMPLETE PO) Take 1 tablet by mouth daily.         Allergies  Levofloxacin, Bacitracin, Codeine, Prednisone, Sulfamethoxazole-trimethoprim, Tramadol, and Vicodin [hydrocodone-acetaminophen]  Review of Systems  Pertinent items noted in HPI and remainder of comprehensive ROS otherwise negative.  Physical Exam  BP 123/82 (BP Location: Right Arm)    Pulse (!) 104    Temp 97.6 F (36.4 C) (Oral)    Resp 18    LMP 05/03/2021   Lungs:  CTA B Cardio: RRR without M/R/G Abd: Soft, gravid, NT Presentation: cephalic EXT: No C/C/ 1+ Edema DTRs: 2+ B CERVIX:    See Prenatal records for more detailed PE.     FHR:  Variability: Good {> 6 bpm)  Toco: Uterine Contractions: None  Test Results  No results found for this or any previous visit (from the past 24 hour(s)).   Assessment   G2P1001 at [redacted]w[redacted]d Estimated Date of Delivery: 02/07/22  The fetus is reassuring.   Patient Active Problem List   Diagnosis Date Noted   Left flank pain 12/29/2021   Prenatal hydronephrosis during pregnancy in third trimester, antepartum 12/10/2021   H/O cesarean section 03/01/2019  Plan  1. Admit to L&D :   2. EFM: -- Category 1 3. Admission labs  5. Desires (specifically discussed all options including LARC) Filshie clip sterilization  Finis Bud, M.D. 01/31/2022 9:47 AM

## 2022-01-31 NOTE — Anesthesia Preprocedure Evaluation (Addendum)
Anesthesia Evaluation  Patient identified by MRN, date of birth, ID band Patient awake    Reviewed: Allergy & Precautions, NPO status , Patient's Chart, lab work & pertinent test results  History of Anesthesia Complications Negative for: history of anesthetic complications  Airway Mallampati: II   Neck ROM: Full    Dental no notable dental hx.    Pulmonary asthma ,    Pulmonary exam normal breath sounds clear to auscultation       Cardiovascular Exercise Tolerance: Good negative cardio ROS Normal cardiovascular exam Rhythm:Regular Rate:Normal     Neuro/Psych negative neurological ROS     GI/Hepatic GERD  ,  Endo/Other  negative endocrine ROS  Renal/GU Renal disease (hydronephrosis this pregnancy)     Musculoskeletal   Abdominal   Peds  Hematology negative hematology ROS (+)   Anesthesia Other Findings 31 yo G2P1001 presenting for repeat c-section with BTL.  Reproductive/Obstetrics                            Anesthesia Physical Anesthesia Plan  ASA: 2  Anesthesia Plan: Spinal   Post-op Pain Management:    Induction:   PONV Risk Score and Plan: 2  Airway Management Planned: Natural Airway and Nasal Cannula  Additional Equipment:   Intra-op Plan:   Post-operative Plan:   Informed Consent: I have reviewed the patients History and Physical, chart, labs and discussed the procedure including the risks, benefits and alternatives for the proposed anesthesia with the patient or authorized representative who has indicated his/her understanding and acceptance.     Dental Advisory Given  Plan Discussed with: Anesthesiologist, CRNA and Surgeon  Anesthesia Plan Comments: (Patient reports no bleeding problems and no anticoagulant use.  Plan for spinal with backup GA  Patient consented for risks of anesthesia including but not limited to:  - adverse reactions to medications -  damage to eyes, teeth, lips or other oral mucosa - nerve damage due to positioning  - risk of bleeding, infection and or nerve damage from spinal that could lead to paralysis - risk of headache or failed spinal - damage to teeth, lips or other oral mucosa - sore throat or hoarseness - damage to heart, brain, nerves, lungs, other parts of body or loss of life  Patient voiced understanding.)        Anesthesia Quick Evaluation

## 2022-01-31 NOTE — Anesthesia Procedure Notes (Signed)
Spinal  Patient location during procedure: OR Start time: 01/31/2022 10:04 AM End time: 01/31/2022 10:07 AM Reason for block: surgical anesthesia Staffing Performed: resident/CRNA  Anesthesiologist: Martha Clan, MD Resident/CRNA: Norm Salt, CRNA Preanesthetic Checklist Completed: patient identified, IV checked, site marked, risks and benefits discussed, surgical consent, monitors and equipment checked, pre-op evaluation and timeout performed Spinal Block Patient position: sitting Prep: ChloraPrep Patient monitoring: heart rate, continuous pulse ox and blood pressure Approach: midline Location: L3-4 Injection technique: single-shot Needle Needle type: Pencan  Needle gauge: 24 G Needle length: 10 cm Assessment Events: CSF return Additional Notes IV functioning, monitors applied to pt. Expiration date of kit checked and confirmed to be in date. Sterile prep and drape, hand hygiene and sterile gloved used. Pt was positioned and spine was prepped in sterile fashion. Skin was anesthetized with lidocaine. Free flow of clear CSF obtained prior to injecting local anesthetic into CSF x 1 attempt. Spinal needle aspirated freely following injection. Needle was carefully withdrawn, and pt tolerated procedure well. Loss of motor and sensory on exam post injection.

## 2022-02-01 ENCOUNTER — Encounter: Payer: Self-pay | Admitting: Obstetrics and Gynecology

## 2022-02-01 NOTE — Progress Notes (Signed)
Patient ID: Bethany Gutierrez, female   DOB: 1991-08-10, 31 y.o.   MRN: [id number]   Progress Note - Cesarean Delivery  Bethany Gutierrez is a 31 y.o. G2P2002 now PP day 1 s/p C-Section, Low Transverse .   Subjective:  Patient reports no problems with eating, bowel movements, voiding, or their wound  She does state that her dressing is starting to pull on her skin and she would like to have it taken off today.  Reports that her pain is well controlled.  She is breast-feeding  - baby cluster fed through the night :(  Objective:  Vital signs in last 24 hours: Temp:  [97.6 F (36.4 C)-99.1 F (37.3 C)] 98.6 F (37 C) (02/28 0300) Pulse Rate:  [60-104] 75 (02/28 0300) Resp:  [13-44] 18 (02/28 0300) BP: (101-138)/(62-119) 104/65 (02/28 0300) SpO2:  [94 %-100 %] 100 % (02/28 0300) Weight:  [99 kg] 99 kg (02/27 0800)  Physical Exam:  General: alert, cooperative, and no distress Lochia: appropriate Uterine Fundus: firm Incision: Dressing intact    Data Review No results for input(s): HGB, HCT in the last 72 hours.  Assessment:  Principal Problem:   Term pregnancy, repeat Active Problems:   Unwanted fertility   Postoperative state   Status post Cesarean section. Doing well postoperatively.     Plan:       Continue current care.  Remove dressing today -patient may shower  Probable discharge tomorrow.  Elonda Husky, M.D. 02/01/2022 7:44 AM

## 2022-02-01 NOTE — Lactation Note (Signed)
This note was copied from a baby's chart. Lactation Consultation Note  Patient Name: Boy Chennel Rowin [id number] Date: 02/01/2022 Reason for consult: Follow-up assessment;Term;Other (Comment) (repeat c-section) Age:30 hours  LC at bedside to teach/demonstrate finger feeding with curved tip syringe. Baby had fed on difficult side: (L) for 4 minutes, but mom had previously expressed 86mL colostrum. Baby took 23mL; tolerated well.  Maternal Data Has patient been taught Hand Expression?: Yes Does the patient have breastfeeding experience prior to this delivery?: Yes How long did the patient breastfeed?: 6+ months  Feeding Mother's Current Feeding Choice: Breast Milk  LATCH Score                    Lactation Tools Discussed/Used Tools: Pump Breast pump type: Manual Pump Education: Setup, frequency, and cleaning;Milk Storage Reason for Pumping: stimulation/evert nipple on L side Pumping frequency: PRN  Interventions Interventions: Hand pump;Education;Breast feeding basics reviewed  Discharge Pump: Personal Pershing Proud; [place])  Consult Status Consult Status: Follow-up Date: 02/01/22 Follow-up type: In-patient    Lavonia Drafts 02/01/2022, 1:02 PM

## 2022-02-01 NOTE — Anesthesia Postprocedure Evaluation (Signed)
Anesthesia Post Note  Patient: Bethany Gutierrez  Procedure(s) Performed: Repeat Cesarean Delivery with Sterilization using Filshie Clips  Patient location during evaluation: Mother Baby Anesthesia Type: Spinal Level of consciousness: oriented and awake and alert Pain management: pain level controlled Vital Signs Assessment: post-procedure vital signs reviewed and stable Respiratory status: spontaneous breathing and respiratory function stable Cardiovascular status: blood pressure returned to baseline and stable Postop Assessment: no headache, no backache, no apparent nausea or vomiting and able to ambulate Anesthetic complications: no   No notable events documented.   Last Vitals:  Vitals:   02/01/22 0020 02/01/22 0300  BP: 105/62 104/65  Pulse: 85 75  Resp: 20 18  Temp: 37.1 C 37 C  SpO2: 95% 100%    Last Pain:  Vitals:   02/01/22 0300  TempSrc: Oral  PainSc: 2                  Juanito Gonyer Lawerance Cruel

## 2022-02-01 NOTE — Lactation Note (Signed)
This note was copied from a baby's chart. Lactation Consultation Note  Patient Name: Boy Malari Paskins [id number] Date: 02/01/2022 Reason for consult: Follow-up assessment;Term;Other (Comment) (repeat c-section) Age:30 hours  Lactation follow-up.  Maternal Data Has patient been taught Hand Expression?: Yes Does the patient have breastfeeding experience prior to this delivery?: Yes How long did the patient breastfeed?: 6+ months  Feeding Mother's Current Feeding Choice: Breast Milk Mom continues to have trouble with L nipple everting enough for baby to sustain latch. Hopes to not have to use nipple shield. She is leaking on that side while feeding on the R.  LATCH Score                    Lactation Tools Discussed/Used Tools: Pump Breast pump type: Manual Pump Education: Setup, frequency, and cleaning;Milk Storage Reason for Pumping: stimulation/evert nipple on L side Pumping frequency: PRN  LC gave hand pump for parents to use today to see if nipple can be everted prior to attempt to feed. Hand pump also to be used today for stimulation/milk removal since baby was circumcised this morning.  Interventions Interventions: Hand pump;Education;Breast feeding basics reviewed  Impact that circ may have on baby's desire to feed today. Stimulation for R breast/milk removal while baby is sleepy. Continue to feed with cues/on demand, and reminded of output expectations for day 2.  Discharge Pump: Personal Pershing Proud; [place])  Consult Status Consult Status: Follow-up Date: 02/01/22 Follow-up type: In-patient    Lavonia Drafts 02/01/2022, 9:39 AM

## 2022-02-02 MED ORDER — IBUPROFEN 600 MG PO TABS: 600.0000 mg | ORAL_TABLET | Freq: Four times a day (QID) | ORAL | 0 refills | Status: DC

## 2022-02-02 NOTE — Progress Notes (Signed)
Discharge instructions, prescriptions, education, and appointments given and explained. Pt verbalized understanding with no further questions, pt wheeled to personal vehicle via staff with belongings. ?

## 2022-02-02 NOTE — Lactation Note (Signed)
This note was copied from a baby's chart. ?Lactation Consultation Note ? ?Patient Name: Bethany Gutierrez ?Today's Date: 02/02/2022 ?Reason for consult: Follow-up assessment;Term;Other (Comment) (LGA) ?Age:30 hours ? ?Lactation follow-up prior to anticipated discharge. ? ?Maternal Data ?Has patient been taught Hand Expression?: Yes ?Does the patient have breastfeeding experience prior to this delivery?: Yes ?How long did the patient breastfeed?: 6 months+ ? ?Feeding ?Mother's Current Feeding Choice: Breast Milk ? ?Feedings have been going well, no pain or discomfort reported. Baby feeding frequently with appropriate output. ? ?LATCH Score ?  ? ?  ? ?  ? ?  ? ?  ? ?  ? ? ?Lactation Tools Discussed/Used ?Tools: Pump ?Breast pump type: Manual ?Reason for Pumping: occasional milk removal; stimulation of nipple ?Mom has been using hand pump as needed on L breast to draw out nipple and express milk. Syringe/finger feedings have been done independently w/ EBM after baby has been at the breast. ? ?Interventions ?Interventions: Breast feeding basics reviewed;Hand express;Pre-pump if needed;Hand pump;DEBP;Education ? ?Encouraged continued on demand feedings, reviewed cluster feeding/growth spurts, and ongoing output expectations. ? ?Discharge ?Discharge Education: Engorgement and breast care;Outpatient recommendation ?Pump: Personal;Manual ? ?Anticipatory guidance given on management of breast changes including fullness and engorgement, nipple care, when to have concerns with feeding. Outpatient lactation information given and encouraged to use. ? ?Consult Status ?Consult Status: Complete ? ? ? ?Lavonia Drafts ?02/02/2022, 9:49 AM ? ? ? ?

## 2022-02-02 NOTE — Discharge Summary (Signed)
? ? ?Physician Obstetric Discharge Summary  ?Patient Name: Bethany Gutierrez ?DOB: 1991-10-23 ?MRN: [id number] ? ?                          Discharge Summary ? ?Date of Admission: 01/31/2022 ?Date of Discharge: 02/02/2022 ?Delivering Provider: Harlin Heys  ? ?Admitting Diagnosis: Term pregnancy, repeat [Z34.90] ?Postoperative state [Z98.890] at [redacted]w[redacted]d ?Secondary diagnosis:  Principal Problem: ?  Term pregnancy, repeat ?Active Problems: ?  Unwanted fertility ?  Postoperative state ? ? ?Mode of Delivery:       low uterine, transverse ?    ?Discharge diagnosis: Term Pregnancy Delivered    ?  ?Intrapartum Procedures: tubal ligation ?  ?Post partum procedures:  ? ?Complications: none ? ?                   Discharge Day SOAP Note: ? ?Subjective: ? The patient has no complaints.  She is ambulating well. She is taking PO well. ?Pain is well controlled with current medications. ?Patient is urinating without difficulty.   ?She is passing flatus.  ?She desires discharge ? ?Objective ? ?Vital signs: ?BP 110/84 (BP Location: Left Arm)   Pulse 68   Temp 98.5 ?F (36.9 ?C) (Axillary)   Resp 18   Ht 5\' 7"  (1.702 m)   Wt 99 kg   LMP 05/03/2021   SpO2 100%   Breastfeeding Unknown   BMI 34.18 kg/m?  ? ?Physical Exam: ?Gen: NAD ?Abdomen:  ?clean, dry, no drainage ?Fundus Fundal Tone: Firm  ?Lochia Amount: Scant  ?   ?Data Review ?Labs: ?Lab Results  ?Component Value Date  ? WBC 8.0 01/28/2022  ? HGB 10.8 (L) 01/28/2022  ? HCT 33.0 (L) 01/28/2022  ? MCV 89.2 01/28/2022  ? PLT 192 01/28/2022  ? ?CBC Latest Ref Rng & Units 01/28/2022 12/28/2021 12/09/2021  ?WBC 4.0 - 10.5 K/uL 8.0 12.3(H) 11.1(H)  ?Hemoglobin 12.0 - 15.0 g/dL 10.8(L) 11.3(L) 11.9(L)  ?Hematocrit 36.0 - 46.0 % 33.0(L) 33.6(L) 36.2  ?Platelets 150 - 400 K/uL 192 236 206  ? ?O POS ? ?Edinburgh Score: ?Edinburgh Postnatal Depression Scale Screening Tool 02/01/2022  ?I have been able to laugh and see the funny side of things. 0  ?I have looked forward with enjoyment to  things. 0  ?I have blamed myself unnecessarily when things went wrong. 1  ?I have been anxious or worried for no good reason. 1  ?I have felt scared or panicky for no good reason. 0  ?Things have been getting on top of me. 1  ?I have been so unhappy that I have had difficulty sleeping. 0  ?I have felt sad or miserable. 0  ?I have been so unhappy that I have been crying. 0  ?The thought of harming myself has occurred to me. 0  ?Edinburgh Postnatal Depression Scale Total 3  ? ? ?Assessment: ? Principal Problem: ?  Term pregnancy, repeat ?Active Problems: ?  Unwanted fertility ?  Postoperative state ? ? Doing well.  Normal progress as expected. ? ?Plan: ? Discharge to home ? Modified rest as directed - may slowly resume normal activities with restrictions  as discussed. ? Medications as written. ? See below for additional. ?  ?   ?Discharge Instructions: Per After Visit Summary. ?Activity: Advance as tolerated. Pelvic rest for 6 weeks.  Also refer to After Visit Summary.  Wound care discussed. ?Diet: Regular ?Medications: ?Allergies as of 02/02/2022   ? ?  Reactions  ? Levofloxacin Other (See Comments)  ? She states it kept her up all night and gave her a really bad migraine.  ? Bacitracin Hives  ? Codeine Nausea And Vomiting  ? rash  ? Prednisone Other (See Comments)  ? Sensative ?Sensative  ? Sulfamethoxazole-trimethoprim Nausea And Vomiting, Other (See Comments)  ? Nausea and vomiting ?Nausea and vomiting  ? Tramadol Nausea And Vomiting  ? Vicodin [hydrocodone-acetaminophen] Nausea And Vomiting, Rash  ? ?  ? ?  ?Medication List  ?  ? ?STOP taking these medications   ? ?Pepcid Complete [id number] MG chewable tablet ?Generic drug: famotidine-calcium carbonate-magnesium hydroxide ?  ? ?  ? ?TAKE these medications   ? ?calcium carbonate 750 MG chewable tablet ?Commonly known as: TUMS EX ?Chew 1 tablet by mouth daily as needed for heartburn. ?  ?FLINTSTONES COMPLETE PO ?Take 1 tablet by mouth daily. ?  ?ibuprofen 600 MG  tablet ?Commonly known as: ADVIL ?Take 1 tablet (600 mg total) by mouth every 6 (six) hours. ?  ? ?  ? ?  ?  ? ? ?  ?Discharge Care Instructions  ?(From admission, onward)  ?  ? ? ?  ? ?  Start     Ordered  ? 02/02/22 0000  No dressing needed       ?Comments: Keep wound area clean and dry  ? 02/02/22 1237  ? ?  ?  ? ?  ? ?Outpatient follow up:  ? Follow-up Information   ? ? Harlin Heys, MD Follow up in 1 week(s).   ?Specialties: Obstetrics and Gynecology, Radiology ?Contact information: ?[address] ?[address] ?[place] [address] ?[phone] ? ? ?  ?  ? ?  ?  ? ?  ? ?Postpartum contraception: Will discuss at first post-partum visit. ? ?Discharged Condition: good ? ?Discharged to: home ? ?Newborn Data: ?Disposition:home with mother ? ?Apgars: APGAR (1 MIN): 8   ?APGAR (5 MINS): 9   ?APGAR (10 MINS):   ? ?Baby Feeding: Breast ? ?Finis Bud, M.D. ?02/02/2022 ?12:42 PM ? ?

## 2022-02-08 ENCOUNTER — Other Ambulatory Visit: Payer: Self-pay

## 2022-02-08 ENCOUNTER — Encounter: Payer: Self-pay | Admitting: Obstetrics and Gynecology

## 2022-02-08 ENCOUNTER — Ambulatory Visit (INDEPENDENT_AMBULATORY_CARE_PROVIDER_SITE_OTHER): Payer: BC Managed Care – PPO | Admitting: Obstetrics and Gynecology

## 2022-02-08 NOTE — Progress Notes (Signed)
Patient presents for incision check post c-section. Patient states she is doing well along with her new baby. She reports no bleeding and occasional soreness. Patient states no other questions or concerns.  ?

## 2022-02-08 NOTE — Progress Notes (Signed)
HPI: ?     [name] is a 31 y.o. [phone] who LMP was No LMP recorded. ? ?Subjective:  ? ?She presents today 1 week from cesarean delivery (repeat).  She reports that she was doing well without issues.  She has no pain.  Her bleeding has stopped.  She continues to breast-feed full-time. ? ?  Hx: ?The following portions of the patient's history were reviewed and updated as appropriate: ?            She  has a past medical history of Asthma, Environmental and seasonal allergies, Family history of adverse reaction to anesthesia, GERD (gastroesophageal reflux disease), History of kidney stones, Pneumonia, and Seasonal allergies. ?She does not have any pertinent problems on file. ?She  has a past surgical history that includes Tonsillectomy; Cesarean section (N/A, 02/22/2019); Wisdom tooth extraction; and Cesarean section with bilateral tubal ligation (N/A, 01/31/2022). ?Her family history includes Diabetes in her maternal grandmother; Hypertension in her maternal grandmother; Lung cancer in her paternal grandmother; Ovarian cancer in her mother. ?She  reports that she has never smoked. She has never used smokeless tobacco. She reports that she does not drink alcohol and does not use drugs. ?She has a current medication list which includes the following prescription(s): calcium carbonate, ibuprofen, and prenatal vit-fe fumarate-fa. ?She is allergic to levofloxacin, bacitracin, codeine, prednisone, sulfamethoxazole-trimethoprim, tramadol, and vicodin [hydrocodone-acetaminophen]. ?      ?Review of Systems:  ?Review of Systems ? ?Constitutional: Denied constitutional symptoms, night sweats, recent illness, fatigue, fever, insomnia and weight loss.  ?Eyes: Denied eye symptoms, eye pain, photophobia, vision change and visual disturbance.  ?Ears/Nose/Throat/Neck: Denied ear, nose, throat or neck symptoms, hearing loss, nasal discharge, sinus congestion and sore throat.  ?Cardiovascular: Denied cardiovascular symptoms,  arrhythmia, chest pain/pressure, edema, exercise intolerance, orthopnea and palpitations.  ?Respiratory: Denied pulmonary symptoms, asthma, pleuritic pain, productive sputum, cough, dyspnea and wheezing.  ?Gastrointestinal: Denied, gastro-esophageal reflux, melena, nausea and vomiting.  ?Genitourinary: Denied genitourinary symptoms including symptomatic vaginal discharge, pelvic relaxation issues, and urinary complaints.  ?Musculoskeletal: Denied musculoskeletal symptoms, stiffness, swelling, muscle weakness and myalgia.  ?Dermatologic: Denied dermatology symptoms, rash and scar.  ?Neurologic: Denied neurology symptoms, dizziness, headache, neck pain and syncope.  ?Psychiatric: Denied psychiatric symptoms, anxiety and depression.  ?Endocrine: Denied endocrine symptoms including hot flashes and night sweats.  ? ?Meds: ?  ?Current Outpatient Medications on File Prior to Visit  ?Medication Sig Dispense Refill  ? calcium carbonate (TUMS EX) 750 MG chewable tablet Chew 1 tablet by mouth daily as needed for heartburn.    ? ibuprofen (ADVIL) 600 MG tablet Take 1 tablet (600 mg total) by mouth every 6 (six) hours. 30 tablet 0  ? Prenatal Vit-Fe Fumarate-FA (PRENATAL PO) Take by mouth.    ? ?No current facility-administered medications on file prior to visit.  ? ? ? ? ?Objective:  ?  ? ?Vitals:  ? 02/08/22 0803  ?BP: 115/82  ?Pulse: 87  ? ?Filed Weights  ? 02/08/22 0803  ?Weight: 193 lb 11.2 oz (87.9 kg)  ? ?  ?          ?Abdomen: Soft.  Non-tender.  No masses.  No HSM.  ?Incision/s: Intact.  Healing well.  No erythema.  No drainage.  ? ? ?        ? ?Assessment:  ?  ?G2P2002 ?Patient Active Problem List  ? Diagnosis Date Noted  ? Term pregnancy, repeat 01/31/2022  ? Postoperative state 01/31/2022  ? Unwanted fertility   ?  Left flank pain 12/29/2021  ? Prenatal hydronephrosis during pregnancy in third trimester, antepartum 12/10/2021  ? H/O cesarean section 03/01/2019  ? ?  ?1. Postpartum care following cesarean delivery    ? ? Patient but excellent recovery so far. ? ? ?Plan:  ?  ?       ? 1.  Discussed wound care. ?Orders ?No orders of the defined types were placed in this encounter. ? ? No orders of the defined types were placed in this encounter. ?  ?  F/U ? Return in about 5 weeks (around 03/15/2022). ? ?Elonda Husky, M.D. ?02/08/2022 ?8:23 AM ? ? ? ? ?

## 2022-02-16 ENCOUNTER — Telehealth: Payer: BC Managed Care – PPO | Admitting: Obstetrics

## 2022-03-15 ENCOUNTER — Encounter: Payer: Self-pay | Admitting: Obstetrics and Gynecology

## 2022-03-15 ENCOUNTER — Ambulatory Visit (INDEPENDENT_AMBULATORY_CARE_PROVIDER_SITE_OTHER): Payer: BC Managed Care – PPO | Admitting: Obstetrics and Gynecology

## 2022-03-15 DIAGNOSIS — Z9889 Other specified postprocedural states: Secondary | ICD-10-CM

## 2022-03-15 NOTE — Progress Notes (Signed)
Patient presents today for 6 weeks postpartum visit. She states she is breastfeeding and that's is going well. Patient states cesarean scar is doing well. Patient states no other questions or concerns at this time.  ?

## 2022-03-15 NOTE — Progress Notes (Signed)
HPI: ?     [name] is a 31 y.o. [phone] who LMP was No LMP recorded. ? ?Subjective:  ? ?She presents today approximately 6 weeks from cesarean delivery with permanent sterilization tubal ligation.  She states that her bleeding has stopped.  She continues to breast-feed full-time.  She says she is not having any issues.  She has resumed normal activities.  She reports the baby is also doing well. ? ?  Hx: ?The following portions of the patient's history were reviewed and updated as appropriate: ?            She  has a past medical history of Asthma, Environmental and seasonal allergies, Family history of adverse reaction to anesthesia, GERD (gastroesophageal reflux disease), History of kidney stones, Pneumonia, and Seasonal allergies. ?She does not have any pertinent problems on file. ?She  has a past surgical history that includes Tonsillectomy; Cesarean section (N/A, 02/22/2019); Wisdom tooth extraction; and Cesarean section with bilateral tubal ligation (N/A, 01/31/2022). ?Her family history includes Diabetes in her maternal grandmother; Hypertension in her maternal grandmother; Lung cancer in her paternal grandmother; Ovarian cancer in her mother. ?She  reports that she has never smoked. She has never used smokeless tobacco. She reports that she does not drink alcohol and does not use drugs. ?She has a current medication list which includes the following prescription(s): calcium carbonate and prenatal vit-fe fumarate-fa. ?She is allergic to levofloxacin, bacitracin, codeine, prednisone, sulfamethoxazole-trimethoprim, tramadol, and vicodin [hydrocodone-acetaminophen]. ?      ?Review of Systems:  ?Review of Systems ? ?Constitutional: Denied constitutional symptoms, night sweats, recent illness, fatigue, fever, insomnia and weight loss.  ?Eyes: Denied eye symptoms, eye pain, photophobia, vision change and visual disturbance.  ?Ears/Nose/Throat/Neck: Denied ear, nose, throat or neck symptoms, hearing loss,  nasal discharge, sinus congestion and sore throat.  ?Cardiovascular: Denied cardiovascular symptoms, arrhythmia, chest pain/pressure, edema, exercise intolerance, orthopnea and palpitations.  ?Respiratory: Denied pulmonary symptoms, asthma, pleuritic pain, productive sputum, cough, dyspnea and wheezing.  ?Gastrointestinal: Denied, gastro-esophageal reflux, melena, nausea and vomiting.  ?Genitourinary: Denied genitourinary symptoms including symptomatic vaginal discharge, pelvic relaxation issues, and urinary complaints.  ?Musculoskeletal: Denied musculoskeletal symptoms, stiffness, swelling, muscle weakness and myalgia.  ?Dermatologic: Denied dermatology symptoms, rash and scar.  ?Neurologic: Denied neurology symptoms, dizziness, headache, neck pain and syncope.  ?Psychiatric: Denied psychiatric symptoms, anxiety and depression.  ?Endocrine: Denied endocrine symptoms including hot flashes and night sweats.  ? ?Meds: ?  ?Current Outpatient Medications on File Prior to Visit  ?Medication Sig Dispense Refill  ? calcium carbonate (TUMS EX) 750 MG chewable tablet Chew 1 tablet by mouth daily as needed for heartburn.    ? Prenatal Vit-Fe Fumarate-FA (PRENATAL PO) Take by mouth.    ? ?No current facility-administered medications on file prior to visit.  ? ? ? ? ?Objective:  ?  ? ?Vitals:  ? 03/15/22 1055  ?BP: 119/86  ?Pulse: 75  ? ?Filed Weights  ? 03/15/22 1055  ?Weight: 184 lb 4.8 oz (83.6 kg)  ? ?  ?  ?Abdomen: Soft.  Non-tender.  No masses.  No HSM.  ?Incision/s: Intact.  Healing well.  No erythema.  No drainage.  ? ? ? ?Assessment:  ?  ?G2P2002 ?Patient Active Problem List  ? Diagnosis Date Noted  ? Term pregnancy, repeat 01/31/2022  ? Postoperative state 01/31/2022  ? Unwanted fertility   ? Left flank pain 12/29/2021  ? Prenatal hydronephrosis during pregnancy in third trimester, antepartum 12/10/2021  ? H/O cesarean section  03/01/2019  ? ?  ?1. Postpartum care following cesarean delivery   ?2. Post-operative state    ? ? Patient doing very well postop and postpartum ? ? ?Plan:  ?  ?       ? 1.  Plan follow-up annual examination in approximately 3 months. ?Orders ?No orders of the defined types were placed in this encounter. ? ? No orders of the defined types were placed in this encounter. ?  ?  F/U ? Return in about 3 months (around 06/14/2022) for Annual Physical. ? ?Elonda Husky, M.D. ?03/15/2022 ?11:09 AM ? ? ? ? ?

## 2022-03-16 ENCOUNTER — Encounter: Payer: BC Managed Care – PPO | Admitting: Obstetrics

## 2022-04-05 ENCOUNTER — Telehealth: Payer: Self-pay | Admitting: Obstetrics and Gynecology

## 2022-04-05 DIAGNOSIS — N61 Mastitis without abscess: Secondary | ICD-10-CM

## 2022-04-05 MED ORDER — DICLOXACILLIN SODIUM 500 MG PO CAPS: 500.0000 mg | ORAL_CAPSULE | Freq: Four times a day (QID) | ORAL | 0 refills | Status: DC

## 2022-04-05 NOTE — Telephone Encounter (Signed)
PT states " left breast is very hard, swollen, clogged feeling, red in color, very sensitive to anything touching. Right breast is very sensitive around the nipple area. Pt states she has chills, body aches, and feels hot and is  experiencing dizziness began last night . Unable to fully breastfeed.  ?

## 2022-04-05 NOTE — Telephone Encounter (Signed)
Spoke with patient. Advised patient medication would be sent in to preferred pharmacy. Informed patient to keep breastfeeding, try warm compresses or shower but ultimately keep expressing milk. Patient stated understanding.  ?

## 2022-06-14 ENCOUNTER — Encounter: Payer: BC Managed Care – PPO | Admitting: Obstetrics and Gynecology

## 2023-01-01 ENCOUNTER — Encounter: Payer: Self-pay | Admitting: Certified Nurse Midwife

## 2023-01-23 ENCOUNTER — Encounter: Payer: Self-pay | Admitting: Physician Assistant

## 2023-01-23 ENCOUNTER — Ambulatory Visit (INDEPENDENT_AMBULATORY_CARE_PROVIDER_SITE_OTHER): Payer: Self-pay | Admitting: Physician Assistant

## 2023-01-23 VITALS — BP 120/86 | HR 95 | Temp 98.4°F | Wt 184.4 lb

## 2023-01-23 DIAGNOSIS — J329 Chronic sinusitis, unspecified: Secondary | ICD-10-CM

## 2023-01-23 MED ORDER — AMOXICILLIN 500 MG PO CAPS: 500.0000 mg | ORAL_CAPSULE | Freq: Two times a day (BID) | ORAL | 0 refills | Status: AC

## 2023-01-23 NOTE — Progress Notes (Signed)
[facility] [address], [place] [address]   Office Visit Note  Patient Name: Bethany Gutierrez Date of Birth [birth year]  Medical Record number [id number]  Date of Service: 01/23/2023  Chief Complaint  Patient presents with   Sinusitis    Started Wed. With nasal congestion, head pressure, green mucus. No BA, Fever, ST.      32 y/o breastfeeding F presents to the clinic for c/o sinus pain and pressure x 5 days. Denies CP, SOB, wheezing, chills, fever, or cough. Has been using anti-histamine nasal spray without any improvement. Tried Mucinex, but no relief at times feels worse afterwards.   Sinusitis Associated symptoms include congestion and sinus pressure. Pertinent negatives include no sore throat.      Current Medication:  Outpatient Encounter Medications as of 01/23/2023  Medication Sig   amoxicillin (AMOXIL) 500 MG capsule Take 1 capsule (500 mg total) by mouth 2 (two) times daily for 7 days.   Prenatal Vit-Fe Fumarate-FA (PRENATAL PO) Take by mouth.   calcium carbonate (TUMS EX) 750 MG chewable tablet Chew 1 tablet by mouth daily as needed for heartburn. (Patient not taking: Reported on 01/23/2023)   [DISCONTINUED] dicloxacillin (DYNAPEN) 500 MG capsule Take 1 capsule (500 mg total) by mouth 4 (four) times daily. (Patient not taking: Reported on 01/23/2023)   No facility-administered encounter medications on file as of 01/23/2023.      Medical History: Past Medical History:  Diagnosis Date   Asthma    Environmental and seasonal allergies    Family history of adverse reaction to anesthesia    mother and sister post op nausea and vomiting   GERD (gastroesophageal reflux disease)    History of kidney stones    Pneumonia    covid related   Seasonal allergies      Vital Signs: BP 120/86 (BP Location: Left Arm, Patient Position: Sitting, Cuff Size: Normal)   Pulse 95   Temp 98.4 F (36.9 C) (Tympanic)   Wt 184 lb 6.4 oz (83.6 kg)   SpO2 98%   BMI  28.88 kg/m    Review of Systems  Constitutional: Negative.   HENT:  Positive for congestion, postnasal drip, sinus pressure and sinus pain. Negative for sore throat and trouble swallowing.   Respiratory: Negative.    Cardiovascular: Negative.   Neurological: Negative.     Physical Exam Constitutional:      Appearance: Normal appearance.  HENT:     Head: Atraumatic.     Right Ear: Ear canal and external ear normal. Tympanic membrane is not perforated or erythematous.     Left Ear: Ear canal and external ear normal. Tympanic membrane is not perforated or erythematous.     Ears:     Comments: +air fluid level present in both ears.     Nose: Nose normal.     Mouth/Throat:     Mouth: Mucous membranes are moist.     Pharynx: Oropharynx is clear.  Eyes:     Extraocular Movements: Extraocular movements intact.  Cardiovascular:     Rate and Rhythm: Normal rate and regular rhythm.  Pulmonary:     Effort: Pulmonary effort is normal.     Breath sounds: Normal breath sounds.  Musculoskeletal:     Cervical back: Neck supple.  Skin:    General: Skin is warm.  Neurological:     Mental Status: She is alert.  Psychiatric:        Mood and Affect: Mood normal.  Behavior: Behavior normal.        Thought Content: Thought content normal.        Judgment: Judgment normal.       Assessment/Plan:  1. Rhinosinusitis - amoxicillin (AMOXIL) 500 MG capsule; Take 1 capsule (500 mg total) by mouth 2 (two) times daily for 7 days.  Dispense: 14 capsule; Refill: 0  Increase fluids Take otc oral anti-histamine as directed on the box. Try Neti pot to help decongest nasal passage. Consider using nasal saline spray Will defer the use of oral decongestant because she is breastfeeding.  If symptoms don't improve in a reasonable time then consider taking oral antibiotic, Amoxicillin, as prescribed. Pt verbalized understanding and in agreement.    General Counseling: Journiee verbalizes  understanding of the findings of todays visit and agrees with plan of treatment. I have discussed any further diagnostic evaluation that may be needed or ordered today. We also reviewed her medications today. she has been encouraged to call the office with any questions or concerns that should arise related to todays visit.    Time spent:[address] [place], [place] Physician Assistant

## 2023-07-21 ENCOUNTER — Ambulatory Visit: Payer: BC Managed Care – PPO | Admitting: Obstetrics

## 2023-08-28 ENCOUNTER — Ambulatory Visit: Payer: Self-pay | Admitting: Obstetrics

## 2023-09-25 ENCOUNTER — Encounter: Payer: Self-pay | Admitting: Obstetrics

## 2023-09-25 ENCOUNTER — Ambulatory Visit: Payer: BC Managed Care – PPO | Admitting: Obstetrics

## 2023-09-25 VITALS — BP 120/87 | HR 80 | Ht 67.0 in | Wt 184.0 lb

## 2023-09-25 DIAGNOSIS — N23 Unspecified renal colic: Secondary | ICD-10-CM

## 2023-09-25 DIAGNOSIS — Z0000 Encounter for general adult medical examination without abnormal findings: Secondary | ICD-10-CM

## 2023-09-25 DIAGNOSIS — Z01419 Encounter for gynecological examination (general) (routine) without abnormal findings: Secondary | ICD-10-CM

## 2023-09-25 DIAGNOSIS — D229 Melanocytic nevi, unspecified: Secondary | ICD-10-CM

## 2023-09-25 DIAGNOSIS — Z9851 Tubal ligation status: Secondary | ICD-10-CM | POA: Insufficient documentation

## 2023-09-25 NOTE — Progress Notes (Signed)
ANNUAL GYNECOLOGICAL EXAM  SUBJECTIVE  HPI  Bethany Gutierrez is a 32 y.o.-year-old [id number] who presents for an annual gynecological exam today.  She denies dyspareunia, abnormal vaginal bleeding or discharge, and UTI symptoms. She reports that she has had pain during ovulation during her last 2 cycles, which is new for her. She is breastfeeding her 42-month-old and just recently began menstruating again. She did have a BTL. She has a h/o ovarian cysts. She also reports intermittent kidney pain in her left kidney. This occurs about every other month. She also notes pain during urination during this time. She would also like a referral to dermatology for some spots on her back.  Medical/Surgical History Past Medical History:  Diagnosis Date   Asthma    Environmental and seasonal allergies    Family history of adverse reaction to anesthesia    mother and sister post op nausea and vomiting   GERD (gastroesophageal reflux disease)    History of kidney stones    Pneumonia    covid related   Seasonal allergies    Past Surgical History:  Procedure Laterality Date   CESAREAN SECTION N/A 02/22/2019   Procedure: CESAREAN SECTION;  Surgeon: Linzie Collin, MD;  Location: [facility];  Service: Obstetrics;  Laterality: N/A;   CESAREAN SECTION WITH BILATERAL TUBAL LIGATION N/A 01/31/2022   Procedure: Repeat Cesarean Delivery with Sterilization using Filshie Clips;  Surgeon: Linzie Collin, MD;  Location: [facility];  Service: Obstetrics;  Laterality: N/A;   TONSILLECTOMY     WISDOM TOOTH EXTRACTION      Social History Lives with husband and children. Feels safe there Work: Sales executive Exercise: walking, work out Engineer, petroleum.  Substances: Denies EtOH, tobacco, vape, and recreational drugs  Obstetric History OB History     Gravida  2   Para  2   Term  2   Preterm      AB      Living  2      SAB      IAB      Ectopic      Multiple  0   Live Births  2             GYN/Menstrual History Patient's last menstrual period was 08/16/2023. Just starting to get periods back Last Pap: 08/2021. NILM. Contraception: BTL  Prevention Dentist: regular visits Eye exam: a few years ago Mammogram: at 41 Colonoscopy: at 44 Flu shot/vaccines: declines  Current Medications Outpatient Medications Prior to Visit  Medication Sig   calcium carbonate (TUMS EX) 750 MG chewable tablet Chew 1 tablet by mouth daily as needed for heartburn. (Patient not taking: Reported on 01/23/2023)   Prenatal Vit-Fe Fumarate-FA (PRENATAL PO) Take by mouth. (Patient not taking: Reported on 09/25/2023)   No facility-administered medications prior to visit.      Upstream - 09/25/23 0910       Pregnancy Intention Screening   Does the patient want to become pregnant in the next year? No    Does the patient's partner want to become pregnant in the next year? No    Would the patient like to discuss contraceptive options today? No      Contraception Wrap Up   Current Method --   tubal   End Method --   tubal   Contraception Counseling Provided No    How was the end contraceptive method provided? N/A            The pregnancy intention screening  data noted above was reviewed. Potential methods of contraception were discussed. The patient elected to proceed with -- (tubal).   ROS Constitutional: Denied constitutional symptoms, night sweats, recent illness, fatigue, fever, insomnia and weight loss.  Eyes: Denied eye symptoms, eye pain, photophobia, vision change and visual disturbance.  Ears/Nose/Throat/Neck: Denied ear, nose, throat or neck symptoms, hearing loss, nasal discharge, sinus congestion and sore throat.  Cardiovascular: Denied cardiovascular symptoms, arrhythmia, chest pain/pressure, edema, exercise intolerance, orthopnea and palpitations.  Respiratory: Denied pulmonary symptoms, asthma, pleuritic pain, productive sputum, cough, dyspnea and wheezing.  Gastrointestinal:  Denied, gastro-esophageal reflux, melena, nausea and vomiting.  Genitourinary: See HPI  Musculoskeletal: Denied musculoskeletal symptoms, stiffness, swelling, muscle weakness and myalgia.  Dermatologic: Denied dermatology symptoms, rash and scar.  Neurologic: Denied neurology symptoms, dizziness, headache, neck pain and syncope.  Psychiatric: Denied psychiatric symptoms, anxiety and depression.  Endocrine: Denied endocrine symptoms including hot flashes and night sweats.    OBJECTIVE  BP 120/87   Pulse 80   Ht 5\' 7"  (1.702 m)   Wt 184 lb (83.5 kg)   LMP 08/16/2023   Breastfeeding Yes   BMI 28.82 kg/m    Physical examination General NAD, Conversant  HEENT Atraumatic; Op clear with mmm.  Normo-cephalic. Pupils reactive. Anicteric sclerae  Thyroid/Neck Smooth without nodularity or enlargement. Normal ROM.  Neck Supple.  Skin No rashes, lesions or ulceration. Normal palpated skin turgor. No nodularity.  Breasts: No masses or discharge.  Symmetric.  No axillary adenopathy.  Lungs: Clear to auscultation.No rales or wheezes. Normal Respiratory effort, no retractions.  Heart: NSR.  No murmurs or rubs appreciated. No peripheral edema  Abdomen/ Back: Soft.  Non-tender.  No masses.  No HSM. No hernia. Tenderness to palpation on left flank.  Extremities: Moves all appropriately.  Normal ROM for age. No lymphadenopathy.  Neuro: Oriented to PPT.  Normal mood. Normal affect.     Pelvic: declined    ASSESSMENT  1) Annual exam 2) Pelvic pain 3) Flank pain  PLAN 1) Physical exam as noted. Discussed healthy lifestyle choices and preventive care. Declines STI testing. Labs: CBC, CMP, TSH, A1C, lipid profile. Pap due 2025. 2) Discussed possible causes, including mittelschmerz, ovarian cysts, and resuming ovulation after BTL. Will continue to monitor and if pain worsens, order imaging. 3) Referral sent to urology.  Return in one year for annual exam or as needed for concerns.   Guadlupe Spanish, CNM

## 2023-09-26 LAB — CBC
Hematocrit: 46.1 % (ref 34.0–46.6)
Hemoglobin: 15 g/dL (ref 11.1–15.9)
MCH: 31.4 pg (ref 26.6–33.0)
MCHC: 32.5 g/dL (ref 31.5–35.7)
MCV: 96 fL (ref 79–97)
Platelets: 196 10*3/uL (ref 150–450)
RBC: 4.78 x10E6/uL (ref 3.77–5.28)
RDW: 11.3 % — ABNORMAL LOW (ref 11.7–15.4)
WBC: 5.2 10*3/uL (ref 3.4–10.8)

## 2023-09-26 LAB — LIPID PANEL
Chol/HDL Ratio: 3.3 ratio (ref 0.0–4.4)
Cholesterol, Total: 151 mg/dL (ref 100–199)
HDL: 46 mg/dL (ref >39)
LDL Chol Calc (NIH): 94 mg/dL (ref 0–99)
Triglycerides: 54 mg/dL (ref 0–149)
VLDL Cholesterol Cal: 11 mg/dL (ref 5–40)

## 2023-09-26 LAB — COMPREHENSIVE METABOLIC PANEL
ALT: 14 [IU]/L (ref 0–32)
AST: 17 [IU]/L (ref 0–40)
Albumin: 4.7 g/dL (ref 4.0–5.0)
Alkaline Phosphatase: 82 [IU]/L (ref 44–121)
BUN/Creatinine Ratio: 16 (ref 9–23)
BUN: 11 mg/dL (ref 6–20)
Bilirubin Total: 0.5 mg/dL (ref 0.0–1.2)
CO2: 22 mmol/L (ref 20–29)
Calcium: 9.1 mg/dL (ref 8.7–10.2)
Chloride: 104 mmol/L (ref 96–106)
Creatinine, Ser: 0.68 mg/dL (ref 0.57–1.00)
Globulin, Total: 2.2 g/dL (ref 1.5–4.5)
Glucose: 85 mg/dL (ref 70–99)
Potassium: 4.2 mmol/L (ref 3.5–5.2)
Sodium: 139 mmol/L (ref 134–144)
Total Protein: 6.9 g/dL (ref 6.0–8.5)
eGFR: 120 mL/min/{1.73_m2} (ref >59)

## 2023-09-26 LAB — HEMOGLOBIN A1C
Est. average glucose Bld gHb Est-mCnc: 108 mg/dL
Hgb A1c MFr Bld: 5.4 % (ref 4.8–5.6)

## 2023-09-26 LAB — TSH: TSH: 1.52 u[IU]/mL (ref 0.450–4.500)

## 2023-09-27 ENCOUNTER — Encounter: Payer: Self-pay | Admitting: Obstetrics

## 2023-10-10 ENCOUNTER — Encounter: Payer: Self-pay | Admitting: Urology

## 2023-10-10 ENCOUNTER — Ambulatory Visit: Payer: BC Managed Care – PPO | Admitting: Urology

## 2023-10-10 VITALS — BP 116/79 | HR 72 | Ht 67.0 in | Wt 185.0 lb

## 2023-10-10 DIAGNOSIS — Z87442 Personal history of urinary calculi: Secondary | ICD-10-CM

## 2023-10-10 DIAGNOSIS — R109 Unspecified abdominal pain: Secondary | ICD-10-CM

## 2023-10-10 DIAGNOSIS — M545 Low back pain, unspecified: Secondary | ICD-10-CM

## 2023-10-10 DIAGNOSIS — N200 Calculus of kidney: Secondary | ICD-10-CM

## 2023-10-10 DIAGNOSIS — G8929 Other chronic pain: Secondary | ICD-10-CM

## 2023-10-10 DIAGNOSIS — Z87448 Personal history of other diseases of urinary system: Secondary | ICD-10-CM

## 2023-10-10 LAB — URINALYSIS, COMPLETE
Bilirubin, UA: NEGATIVE
Glucose, UA: NEGATIVE
Ketones, UA: NEGATIVE
Leukocytes,UA: NEGATIVE
Nitrite, UA: NEGATIVE
Protein,UA: NEGATIVE
RBC, UA: NEGATIVE
Specific Gravity, UA: <1.005 — ABNORMAL LOW (ref 1.005–1.030)
Urobilinogen, Ur: 0.2 mg/dL (ref 0.2–1.0)
pH, UA: 6 (ref 5.0–7.5)

## 2023-10-10 LAB — MICROSCOPIC EXAMINATION
Bacteria, UA: NONE SEEN
RBC, Urine: NONE SEEN /[HPF] (ref 0–2)

## 2023-10-10 NOTE — Progress Notes (Signed)
I,Amy L Pierron,acting as a scribe for Vanna Scotland, MD.,have documented all relevant documentation on the behalf of Vanna Scotland, MD,as directed by  Vanna Scotland, MD while in the presence of Vanna Scotland, MD.  10/10/2023 8:35 PM   Bethany Gutierrez 1991-08-28 [id number]  Referring provider: Glenetta Borg, CNM [address] [place],  [place] [address]  Chief Complaint  Patient presents with   Nephrolithiasis    HPI: 32 year-old female presents today for evaluation of flank pain.  She has a renal ultrasound while gravid on 12/27/2021 that showed some right and left sided hydronephrosis.   She reports that the pain is radiating and is worse than her c-section. The right side is worse than the left. She has urinary frequency.  Friday morning she had back pain, nausea, and pressure when using the bathroom. The symptoms began to worsen on Friday until now.   She has a personal history of kidney stones and these symptoms remind her of when she had them in the past. Her first kidney stone was at age 65. Another occurence 3-4 years later. Her last one was maybe a couple years ago.   Also every few weeks she has intermittent right low back pain since her pregnancy, but seems different than her previous stone events.   Results for orders placed or performed in visit on 10/10/23  Microscopic Examination   Urine  Result Value Ref Range   WBC, UA 0-5 0 - 5 /hpf   RBC, Urine None seen 0 - 2 /hpf   Epithelial Cells (non renal) 0-10 0 - 10 /hpf   Bacteria, UA None seen None seen/Few  Urinalysis, Complete  Result Value Ref Range   Specific Gravity, UA <1.005 (L) 1.005 - 1.030   pH, UA 6.0 5.0 - 7.5   Color, UA Straw Yellow   Appearance Ur Clear Clear   Leukocytes,UA Negative Negative   Protein,UA Negative Negative/Trace   Glucose, UA Negative Negative   Ketones, UA Negative Negative   RBC, UA Negative Negative   Bilirubin, UA Negative Negative   Urobilinogen, Ur 0.2 0.2 -  1.0 mg/dL   Nitrite, UA Negative Negative   Microscopic Examination See below:      PMH: Past Medical History:  Diagnosis Date   Asthma    Environmental and seasonal allergies    Family history of adverse reaction to anesthesia    mother and sister post op nausea and vomiting   GERD (gastroesophageal reflux disease)    History of kidney stones    Pneumonia    covid related   Postoperative state 01/31/2022   Prenatal hydronephrosis during pregnancy in third trimester, antepartum 12/10/2021   Seasonal allergies    Term pregnancy, repeat 01/31/2022   Unwanted fertility     Surgical History: Past Surgical History:  Procedure Laterality Date   CESAREAN SECTION N/A 02/22/2019   Procedure: CESAREAN SECTION;  Surgeon: Linzie Collin, MD;  Location: [facility];  Service: Obstetrics;  Laterality: N/A;   CESAREAN SECTION WITH BILATERAL TUBAL LIGATION N/A 01/31/2022   Procedure: Repeat Cesarean Delivery with Sterilization using Filshie Clips;  Surgeon: Linzie Collin, MD;  Location: [facility];  Service: Obstetrics;  Laterality: N/A;   TONSILLECTOMY     WISDOM TOOTH EXTRACTION      Home Medications:  Allergies as of 10/10/2023       Reactions   Levofloxacin Other (See Comments)   She states it kept her up all night and gave her a really  bad migraine.   Bacitracin Hives   Codeine Nausea And Vomiting   rash   Prednisone Other (See Comments)   Sensative Sensative   Sulfamethoxazole-trimethoprim Nausea And Vomiting, Other (See Comments)   Nausea and vomiting Nausea and vomiting   Tramadol Nausea And Vomiting   Vicodin [hydrocodone-acetaminophen] Nausea And Vomiting, Rash        Medication List        Accurate as of October 10, 2023  8:35 PM. If you have any questions, ask your nurse or doctor.          STOP taking these medications    calcium carbonate 750 MG chewable tablet Commonly known as: TUMS EX Stopped by: Vanna Scotland   PRENATAL PO Stopped by:  Vanna Scotland        Allergies:  Allergies  Allergen Reactions   Levofloxacin Other (See Comments)    She states it kept her up all night and gave her a really bad migraine.   Bacitracin Hives   Codeine Nausea And Vomiting    rash   Prednisone Other (See Comments)    Sensative Sensative    Sulfamethoxazole-Trimethoprim Nausea And Vomiting and Other (See Comments)    Nausea and vomiting Nausea and vomiting    Tramadol Nausea And Vomiting   Vicodin [Hydrocodone-Acetaminophen] Nausea And Vomiting and Rash    Family History: Family History  Problem Relation Age of Onset   Ovarian cancer Mother    Diabetes Maternal Grandmother    Hypertension Maternal Grandmother    Lung cancer Paternal Grandmother     Social History:  reports that she has never smoked. She has never used smokeless tobacco. She reports that she does not drink alcohol and does not use drugs.   Physical Exam: BP 116/79   Pulse 72   Ht 5\' 7"  (1.702 m)   Wt 185 lb (83.9 kg)   LMP 08/16/2023   BMI 28.98 kg/m   Constitutional:  Alert and oriented, No acute distress. HEENT: [place] AT, moist mucus membranes.  Trachea midline, no masses. Neurologic: Grossly intact, no focal deficits, moving all 4 extremities. Psychiatric: Normal mood and affect.   Urinalysis Results for orders placed or performed in visit on 10/10/23  Microscopic Examination   Urine  Result Value Ref Range   WBC, UA 0-5 0 - 5 /hpf   RBC, Urine None seen 0 - 2 /hpf   Epithelial Cells (non renal) 0-10 0 - 10 /hpf   Bacteria, UA None seen None seen/Few  Urinalysis, Complete  Result Value Ref Range   Specific Gravity, UA <1.005 (L) 1.005 - 1.030   pH, UA 6.0 5.0 - 7.5   Color, UA Straw Yellow   Appearance Ur Clear Clear   Leukocytes,UA Negative Negative   Protein,UA Negative Negative/Trace   Glucose, UA Negative Negative   Ketones, UA Negative Negative   RBC, UA Negative Negative   Bilirubin, UA Negative Negative   Urobilinogen, Ur  0.2 0.2 - 1.0 mg/dL   Nitrite, UA Negative Negative   Microscopic Examination See below:      Pertinent Imaging: Results for orders placed during the hospital encounter of 12/28/21  US RENAL  Narrative CLINICAL DATA:  Pregnant and with left flank pain.  EXAM: RENAL / URINARY TRACT ULTRASOUND COMPLETE  COMPARISON:  Renal ultrasound 12/10/2021.  FINDINGS: Right Kidney:  Renal measurements: 12.9 x 5.2 x 5.0 cm = volume: 175.1 mL. Echogenicity within normal limits. No mass, stones or hydronephrosis visualized.  Left Kidney:  Renal  measurements: 13.5 x 6.9 x 5.6 cm = volume: 273.6 mL. Echogenicity within normal limits. No mass or calculus visualized. There is interval development of mild hydronephrosis  Bladder:  Not well seen due to partial contraction, but bilateral ureteral jets are confirmed.  Other:  No free fluid is seen.  IMPRESSION: 1. Since ultrasound 12/10/2021, interval development of mild left hydronephrosis. This could be due to obstructive uropathy or related to pregnancy. Bilateral ureteral jets are confirmed on exam. 2. There previously was mild right hydronephrosis, which is no longer seen.   Electronically Signed By: Almira Bar M.D. On: 12/29/2021 02:17 Personally reviewed the above scan and agree with radiologic interpretation.    Assessment & Plan:    1. Right flank pain/ History of kidney stones  - Plan for CT stone scan once approved by insurance. She will send a MyChart message when it is done so the images can be viewed. Will discuss next steps at that time.  - Encouraged drinking lots of water, using ibuprofen if needed. If pain gets too severe or she develops a fever, instructed her to go the ER.   2. Right low back pain  - Different type of pain that the above. This is likely musculoskeletal.  3. History of hydronephrosis Pregnancy related, will assess with CT scan, anticipate that this has resolved in the absence of a gravid  uterus, unless she has a stone  Renal function is normal, creatinine 0.68 on 09/25/2023  Return in about 3 weeks (around 10/31/2023).  I have reviewed the above documentation for accuracy and completeness, and I agree with the above.   Vanna Scotland, MD  [facility] [address], [address] [place], [place] [address] [phone]

## 2024-03-28 ENCOUNTER — Encounter: Payer: Self-pay | Admitting: Certified Nurse Midwife

## 2024-05-08 ENCOUNTER — Ambulatory Visit: Admitting: Certified Nurse Midwife

## 2024-09-23 ENCOUNTER — Ambulatory Visit: Payer: BC Managed Care – PPO | Admitting: Dermatology

## 2024-09-23 DIAGNOSIS — L578 Other skin changes due to chronic exposure to nonionizing radiation: Secondary | ICD-10-CM | POA: Diagnosis not present

## 2024-09-23 DIAGNOSIS — D1801 Hemangioma of skin and subcutaneous tissue: Secondary | ICD-10-CM

## 2024-09-23 DIAGNOSIS — D225 Melanocytic nevi of trunk: Secondary | ICD-10-CM

## 2024-09-23 DIAGNOSIS — B078 Other viral warts: Secondary | ICD-10-CM | POA: Diagnosis not present

## 2024-09-23 DIAGNOSIS — L814 Other melanin hyperpigmentation: Secondary | ICD-10-CM | POA: Diagnosis not present

## 2024-09-23 DIAGNOSIS — W908XXA Exposure to other nonionizing radiation, initial encounter: Secondary | ICD-10-CM | POA: Diagnosis not present

## 2024-09-23 DIAGNOSIS — D2239 Melanocytic nevi of other parts of face: Secondary | ICD-10-CM | POA: Diagnosis not present

## 2024-09-23 DIAGNOSIS — Z1283 Encounter for screening for malignant neoplasm of skin: Secondary | ICD-10-CM

## 2024-09-23 DIAGNOSIS — D489 Neoplasm of uncertain behavior, unspecified: Secondary | ICD-10-CM

## 2024-09-23 DIAGNOSIS — D229 Melanocytic nevi, unspecified: Secondary | ICD-10-CM

## 2024-09-23 DIAGNOSIS — L858 Other specified epidermal thickening: Secondary | ICD-10-CM

## 2024-09-23 DIAGNOSIS — D2371 Other benign neoplasm of skin of right lower limb, including hip: Secondary | ICD-10-CM

## 2024-09-23 DIAGNOSIS — D239 Other benign neoplasm of skin, unspecified: Secondary | ICD-10-CM

## 2024-09-23 NOTE — Patient Instructions (Addendum)
 Biopsy Wound Care Instructions  Leave the original bandage on for 24 hours if possible.  If the bandage becomes soaked or soiled before that time, it is OK to remove it and examine the wound.  A small amount of post-operative bleeding is normal.  If excessive bleeding occurs, remove the bandage, place gauze over the site and apply continuous pressure (no peeking) over the area for 30 minutes. If this does not work, please call our clinic as soon as possible or page your doctor if it is after hours.   Once a day, cleanse the wound with soap and water. It is fine to shower. If a thick crust develops you may use a Q-tip dipped into dilute hydrogen peroxide (mix 1:1 with water) to dissolve it.  Hydrogen peroxide can slow the healing process, so use it only as needed.    After washing, apply petroleum jelly (Vaseline) or an antibiotic ointment if your doctor prescribed one for you, followed by a bandage.    For best healing, the wound should be covered with a layer of ointment at all times. If you are not able to keep the area covered with a bandage to hold the ointment in place, this may mean re-applying the ointment several times a day.  Continue this wound care until the wound has healed and is no longer open.   Itching and mild discomfort is normal during the healing process. However, if you develop pain or severe itching, please call our office.   If you have any discomfort, you can take Tylenol  (acetaminophen ) or ibuprofen  as directed on the bottle. (Please do not take these if you have an allergy to them or cannot take them for another reason).  Some redness, tenderness and white or yellow material in the wound is normal healing.  If the area becomes very sore and red, or develops a thick yellow-green material (pus), it may be infected; please notify us .    If you have stitches, return to clinic as directed to have the stitches removed. You will continue wound care for 2-3 days after the stitches  are removed.   Wound healing continues for up to one year following surgery. It is not unusual to experience pain in the scar from time to time during the interval.  If the pain becomes severe or the scar thickens, you should notify the office.    A slight amount of redness in a scar is expected for the first six months.  After six months, the redness will fade and the scar will soften and fade.  The color difference becomes less noticeable with time.  If there are any problems, return for a post-op surgery check at your earliest convenience.  To improve the appearance of the scar, you can use silicone scar gel, cream, or sheets (such as Mederma or Serica) every night for up to one year. These are available over the counter (without a prescription).  Please call our office at [phone] for any questions or concerns.   Viral Wart (HPV) Counseling  Discussed viral / HPV (Human Papilloma Virus) etiology and risk of spread /infectivity to other areas of body as well as to other people.  Multiple treatments and methods may be required to clear warts and it is possible treatment may not be successful.  Treatment risks include discoloration; scarring and there is still potential for wart recurrence.   Cryotherapy Aftercare  Wash gently with soap and water everyday.   Apply Vaseline and Band-Aid daily until  healed.    Melanoma ABCDEs  Melanoma is the most dangerous type of skin cancer, and is the leading cause of death from skin disease.  You are more likely to develop melanoma if you: Have light-colored skin, light-colored eyes, or red or blond hair Spend a lot of time in the sun Tan regularly, either outdoors or in a tanning bed Have had blistering sunburns, especially during childhood Have a close family member who has had a melanoma Have atypical moles or large birthmarks  Early detection of melanoma is key since treatment is typically straightforward and cure rates are extremely high  if we catch it early.   The first sign of melanoma is often a change in a mole or a new dark spot.  The ABCDE system is a way of remembering the signs of melanoma.  A for asymmetry:  The two halves do not match. B for border:  The edges of the growth are irregular. C for color:  A mixture of colors are present instead of an even brown color. D for diameter:  Melanomas are usually (but not always) greater than 6mm - the size of a pencil eraser. E for evolution:  The spot keeps changing in size, shape, and color.  Please check your skin once per month between visits. You can use a small mirror in front and a large mirror behind you to keep an eye on the back side or your body.   If you see any new or changing lesions before your next follow-up, please call to schedule a visit.  Please continue daily skin protection including broad spectrum sunscreen SPF 30+ to sun-exposed areas, reapplying every 2 hours as needed when you're outdoors.   Staying in the shade or wearing long sleeves, sun glasses (UVA+UVB protection) and wide brim hats (4-inch brim around the entire circumference of the hat) are also recommended for sun protection.    Due to recent changes in healthcare laws, you may see results of your pathology and/or laboratory studies on MyChart before the doctors have had a chance to review them. We understand that in some cases there may be results that are confusing or concerning to you. Please understand that not all results are received at the same time and often the doctors may need to interpret multiple results in order to provide you with the best plan of care or course of treatment. Therefore, we ask that you please give us  2 business days to thoroughly review all your results before contacting the office for clarification. Should we see a critical lab result, you will be contacted sooner.   If You Need Anything After Your Visit  If you have any questions or concerns for your doctor,  please call our main line at [phone] and press option 4 to reach your doctor's medical assistant. If no one answers, please leave a voicemail as directed and we will return your call as soon as possible. Messages left after 4 pm will be answered the following business day.   You may also send us  a message via MyChart. We typically respond to MyChart messages within 1-2 business days.  For prescription refills, please ask your pharmacy to contact our office. Our fax number is [phone].  If you have an urgent issue when the clinic is closed that cannot wait until the next business day, you can page your doctor at the number below.    Please note that while we do our best to be available for urgent issues  outside of office hours, we are not available 24/7.   If you have an urgent issue and are unable to reach us , you may choose to seek medical care at your doctor's office, retail clinic, urgent care center, or emergency room.  If you have a medical emergency, please immediately call 911 or go to the emergency department.  Pager Numbers  - Dr. Hester: [phone]  - Dr. Jackquline: [phone]  - Dr. Claudene: [phone]   - Dr. Raymund: [phone]  In the event of inclement weather, please call our main line at [phone] for an update on the status of any delays or closures.  Dermatology Medication Tips: Please keep the boxes that topical medications come in in order to help keep track of the instructions about where and how to use these. Pharmacies typically print the medication instructions only on the boxes and not directly on the medication tubes.   If your medication is too expensive, please contact our office at [phone] option 4 or send us  a message through MyChart.   We are unable to tell what your co-pay for medications will be in advance as this is different depending on your insurance coverage. However, we may be able to find a substitute medication at lower  cost or fill out paperwork to get insurance to cover a needed medication.   If a prior authorization is required to get your medication covered by your insurance company, please allow us  1-2 business days to complete this process.  Drug prices often vary depending on where the prescription is filled and some pharmacies may offer cheaper prices.  The website www.goodrx.com contains coupons for medications through different pharmacies. The prices here do not account for what the cost may be with help from insurance (it may be cheaper with your insurance), but the website can give you the price if you did not use any insurance.  - You can print the associated coupon and take it with your prescription to the pharmacy.  - You may also stop by our office during regular business hours and pick up a GoodRx coupon card.  - If you need your prescription sent electronically to a different pharmacy, notify our office through [facility] or by phone at [phone] option 4.     Si Usted Necesita Algo Despus de Su Visita  Tambin puede enviarnos un mensaje a travs de Clinical cytogeneticist. Por lo general respondemos a los mensajes de MyChart en el transcurso de 1 a 2 das hbiles.  Para renovar recetas, por favor pida a su farmacia que se ponga en contacto con nuestra oficina. Randi lakes de fax es [place] [phone].  Si tiene un asunto urgente cuando la clnica est cerrada y que no puede esperar hasta el siguiente da hbil, puede llamar/localizar a su doctor(a) al nmero que aparece a continuacin.   Por favor, tenga en cuenta que aunque hacemos todo lo posible para estar disponibles para asuntos urgentes fuera del horario de [place], no estamos disponibles las 24 horas del da, los 7 [address]  [address] de la [place].   Si tiene un problema urgente y no puede comunicarse con nosotros, puede optar por buscar atencin mdica  en el consultorio de su doctor(a), en una clnica privada, en un centro de atencin urgente o en  una sala de emergencias.  Si tiene Engineer, drilling, por favor llame inmediatamente al 911 o vaya a la sala de emergencias.  Nmeros de bper  - Dr. Hester: [phone]  - Dra. Jackquline: [phone]  -  Dr. Claudene: [phone]  - Dra. Kitts: [phone]  En caso de inclemencias del [place], por favor llame a nuestra lnea principal al [phone] para una actualizacin sobre el estado de cualquier retraso o cierre.  Consejos para la medicacin en dermatologa: Por favor, guarde las cajas en las que vienen los medicamentos de uso tpico para ayudarle a seguir las instrucciones sobre dnde y cmo usarlos. Las farmacias generalmente imprimen las instrucciones del medicamento slo en las cajas y no directamente en los tubos del [place].   Si su medicamento es muy caro, por favor, pngase en contacto con landry rieger llamando al [phone] y presione la opcin 4 o envenos un mensaje a travs de Clinical cytogeneticist.   No podemos decirle cul ser su copago por los medicamentos por adelantado ya que esto es diferente dependiendo de la cobertura de su seguro. Sin embargo, es posible que podamos encontrar un medicamento sustituto a Audiological scientist un formulario para que el seguro cubra el medicamento que se considera necesario.   Si se requiere una autorizacin previa para que su compaa de seguros malta su medicamento, por favor permtanos de 1 a 2 das hbiles para completar este proceso.  Los precios de los medicamentos varan con frecuencia dependiendo del Environmental consultant de dnde se surte la receta y alguna farmacias pueden ofrecer precios ms baratos.  El sitio web www.goodrx.com tiene cupones para medicamentos de Health and safety inspector. Los precios aqu no tienen en cuenta lo que podra costar con la ayuda del seguro (puede ser ms barato con su seguro), pero el sitio web puede darle el precio si no utiliz Tourist information centre manager.  - Puede imprimir el cupn correspondiente y llevarlo con su receta a  la farmacia.  - Tambin puede pasar por nuestra oficina durante el horario de atencin regular y Education officer, museum una tarjeta de cupones de GoodRx.  - Si necesita que su receta se enve electrnicamente a una farmacia diferente, informe a nuestra oficina a travs de MyChart de [place] o por telfono llamando al [phone] y presione la opcin 4.

## 2024-09-23 NOTE — Progress Notes (Signed)
 New Patient Visit   Subjective  Bethany Gutierrez is a 33 y.o. female who presents for the following: Skin Cancer Screening and Full Body Skin Exam Denies personal history of skin cancer  Reports a few spots on left face, back, chest, upper left thigh and legs. Moles on face are irritating.  The patient presents for Total-Body Skin Exam (TBSE) for skin cancer screening and mole check. The patient has spots, moles and lesions to be evaluated, some may be new or changing and the patient may have concern these could be cancer.    The following portions of the chart were reviewed this encounter and updated as appropriate: medications, allergies, medical history  Review of Systems:  No other skin or systemic complaints except as noted in HPI or Assessment and Plan.  Objective  Well appearing patient in no apparent distress; mood and affect are within normal limits.  A full examination was performed including scalp, head, eyes, ears, nose, lips, neck, chest, axillae, abdomen, back, buttocks, bilateral upper extremities, bilateral lower extremities, hands, feet, fingers, toes, fingernails, and toenails. All findings within normal limits unless otherwise noted below.   Relevant physical exam findings are noted in the Assessment and Plan.  left mid nasolabial 5 mm tan papule   left leg x 9, right leg x 6 (15) Flat flesh pink/tan papules at b/l lower legs  Discussed viral etiology and contagion.   Assessment & Plan   SKIN CANCER SCREENING PERFORMED TODAY.  ACTINIC DAMAGE - Chronic condition, secondary to cumulative UV/sun exposure - diffuse scaly erythematous macules with underlying dyspigmentation - Recommend daily broad spectrum sunscreen SPF 30+ to sun-exposed areas, reapply every 2 hours as needed.  - Staying in the shade or wearing long sleeves, sun glasses (UVA+UVB protection) and wide brim hats (4-inch brim around the entire circumference of the hat) are also recommended for sun  protection.  - Call for new or changing lesions.  LENTIGINES, HEMANGIOMAS - Benign normal skin lesions - Benign-appearing - Call for any changes  MELANOCYTIC NEVI - 4 mm medium dark brown macule left medial breast  - 2.5 mm medium dark brown macule at left upper chest  - Tan-brown and/or pink-flesh-colored symmetric macules and papules, including left nasolabial , left paranasal, left cheek, multiple nevi at back - Benign appearing on exam today - Observation - Call clinic for new or changing moles - Recommend daily use of broad spectrum spf 30+ sunscreen to sun-exposed areas.   KERATOSIS PILARIS - Tiny follicular keratotic papules - Benign. Genetic in nature. No cure. - Observe. - If desired, patient can use an emollient (moisturizer) containing ammonium lactate (AmLactin), urea or salicylic acid once a day to smooth the area  Recommend starting moisturizer with exfoliant (Urea, Salicylic acid, or Lactic acid) one to two times daily to help smooth rough and bumpy skin.  OTC options include Cetaphil Rough and Bumpy lotion (Urea), Eucerin Roughness Relief lotion or spot treatment cream (Urea), CeraVe SA lotion/cream for Rough and Bumpy skin (Sal Acid), Gold Bond Rough and Bumpy cream (Sal Acid), and AmLactin 12% lotion/cream (Lactic Acid).  If applying in morning, also apply sunscreen to sun-exposed areas, since these exfoliating moisturizers can increase sensitivity to sun.   DERMATOFIBROMA Exam: Firm pink/brown papulenodule with dimple sign left upper thigh   Treatment Plan: A dermatofibroma is a benign growth possibly related to trauma, such as an insect bite, cut from shaving, or inflamed acne-type bump.  Treatment options to remove include shave or excision with  resulting scar and risk of recurrence.  Since benign-appearing and not bothersome, will observe for now.     NEOPLASM OF UNCERTAIN BEHAVIOR left mid nasolabial Epidermal / dermal shaving  Lesion diameter (cm):   0.5 Informed consent: discussed and consent obtained   Patient was prepped and draped in usual sterile fashion: Area prepped with alcohol. Anesthesia: the lesion was anesthetized in a standard fashion   Anesthetic:  1% lidocaine  w/ epinephrine 1-100,000 buffered w/ 8.4% NaHCO3 Instrument used: flexible razor blade   Hemostasis achieved with: pressure, aluminum chloride and electrodesiccation   Outcome: patient tolerated procedure well   Post-procedure details: wound care instructions given   Post-procedure details comment:  Ointment and small bandage applied.   Specimen 1 - Surgical pathology Differential Diagnosis: irritated nevus vs other   Check Margins: yes Irritated nevus vs other  VERRUCA PLANA (15) left leg x 9, right leg x 6 (15) Viral Wart (HPV) Counseling  Discussed viral / HPV (Human Papilloma Virus) etiology and risk of spread /infectivity to other areas of body as well as to other people.  Multiple treatments and methods may be required to clear warts and it is possible treatment may not be successful.  Treatment risks include discoloration; scarring and there is still potential for wart recurrence.  Avoid shaving over warts, since may spread    Destruction of lesion - left leg x 9, right leg x 6 (15)  Destruction method: cryotherapy   Informed consent: discussed and consent obtained   Lesion destroyed using liquid nitrogen: Yes   Region frozen until ice ball extended beyond lesion: Yes   Outcome: patient tolerated procedure well with no complications   Post-procedure details: wound care instructions given   Additional details:  Prior to procedure, discussed risks of blister formation, small wound, skin dyspigmentation, or rare scar following cryotherapy. Recommend Vaseline ointment to treated areas while healing.    Return in about 1 year (around 09/23/2025) for TBSE.  I, Eleanor Blush, CMA, am acting as scribe for Rexene Rattler, MD.   Documentation: I have  reviewed the above documentation for accuracy and completeness, and I agree with the above.  Rexene Rattler, MD

## 2024-09-26 ENCOUNTER — Ambulatory Visit: Admitting: Obstetrics

## 2024-09-26 LAB — SURGICAL PATHOLOGY

## 2024-09-30 ENCOUNTER — Ambulatory Visit: Payer: Self-pay | Admitting: Dermatology

## 2024-09-30 NOTE — Telephone Encounter (Signed)
-----   Message from Rexene Rattler sent at 09/30/2024  9:08 AM EDT -----  1. Skin, left mid nasolabial :       MELANOCYTIC NEVUS, INTRADERMAL TYPE, BASE INVOLVED   Benign mole, may recur - please call patient ----- Message ----- From: Interface, Lab In [facility] One Sent: 09/26/2024   3:59 PM EDT To: Rexene Rattler, MD

## 2024-09-30 NOTE — Telephone Encounter (Signed)
 Left message advising patient biopsy was benign nevus, no further treatment needed.

## 2024-10-09 ENCOUNTER — Encounter: Payer: Self-pay | Admitting: *Deleted

## 2024-10-25 NOTE — Progress Notes (Deleted)
 ANNUAL GYNECOLOGICAL EXAM  SUBJECTIVE  HPI  Bethany Gutierrez is a 33 y.o.-year-old [id number] who presents for an annual gynecological exam today.  She denies pelvic pain, dyspareunia, abnormal vaginal bleeding or discharge, and UTI symptoms. ***  Medical/Surgical History Past Medical History:  Diagnosis Date   Asthma    Environmental and seasonal allergies    Family history of adverse reaction to anesthesia    mother and sister post op nausea and vomiting   GERD (gastroesophageal reflux disease)    History of kidney stones    Pneumonia    covid related   Postoperative state 01/31/2022   Prenatal hydronephrosis during pregnancy in third trimester, antepartum 12/10/2021   Seasonal allergies    Term pregnancy, repeat 01/31/2022   Unwanted fertility    Past Surgical History:  Procedure Laterality Date   CESAREAN SECTION N/A 02/22/2019   Procedure: CESAREAN SECTION;  Surgeon: Janit Alm Agent, MD;  Location: [facility] ORS;  Service: Obstetrics;  Laterality: N/A;   CESAREAN SECTION WITH BILATERAL TUBAL LIGATION N/A 01/31/2022   Procedure: Repeat Cesarean Delivery with Sterilization using Filshie Clips;  Surgeon: Janit Alm Agent, MD;  Location: [facility] ORS;  Service: Obstetrics;  Laterality: N/A;   TONSILLECTOMY     WISDOM TOOTH EXTRACTION      Social History Lives with husband. She feels safe there Work: Sales Executive Exercise:  walking, work out engineer, petroleum.  Substances: Denies EtOH, tobacco, vape, and recreational drugs  Obstetric History OB History     Gravida  2   Para  2   Term  2   Preterm      AB      Living  2      SAB      IAB      Ectopic      Multiple  0   Live Births  2            GYN/Menstrual History No LMP recorded. {Regular/irregular menstrual period abdominal pain hpi [id number]} Last Pap: 08/20/2021: NILM Contraception: BTL  Prevention Dentist Eye exam Mammogram: at age 4  Colonoscopy: at age 55 Flu shot/vaccines:  Declines  Current Medications Outpatient Medications Prior to Visit  Medication Sig   albuterol  (VENTOLIN  HFA) 108 (90 Base) MCG/ACT inhaler Inhale 1 puff into the lungs every 6 (six) hours as needed.   pantoprazole (PROTONIX) 40 MG tablet Take 40 mg by mouth daily.   No facility-administered medications prior to visit.        ROS Constitutional: Denied constitutional symptoms, night sweats, recent illness, fatigue, fever, insomnia and weight loss.  Eyes: Denied eye symptoms, eye pain, photophobia, vision change and visual disturbance.  Ears/Nose/Throat/Neck: Denied ear, nose, throat or neck symptoms, hearing loss, nasal discharge, sinus congestion and sore throat.  Cardiovascular: Denied cardiovascular symptoms, arrhythmia, chest pain/pressure, edema, exercise intolerance, orthopnea and palpitations.  Respiratory: Denied pulmonary symptoms, asthma, pleuritic pain, productive sputum, cough, dyspnea and wheezing.  Gastrointestinal: Denied gastro-esophageal reflux, melena, nausea and vomiting.  Genitourinary: Denied genitourinary symptoms including symptomatic vaginal discharge, pelvic relaxation issues, and urinary complaints.  Musculoskeletal: Denied musculoskeletal symptoms, stiffness, swelling, muscle weakness and myalgia.  Dermatologic: Denied dermatology symptoms, rash and scar.  Neurologic: Denied neurology symptoms, dizziness, headache, neck pain and syncope.  Psychiatric: Denied psychiatric symptoms, anxiety and depression.  Endocrine: Denied endocrine symptoms including hot flashes and night sweats.    OBJECTIVE  There were no vitals taken for this visit.   Physical examination General NAD, Conversant  HEENT Atraumatic; Op clear  with mmm.  Normo-cephalic. Pupils reactive. Anicteric sclerae  Thyroid/Neck Smooth without nodularity or enlargement. Normal ROM.  Neck Supple.  Skin No rashes, lesions or ulceration. Normal palpated skin turgor. No nodularity.  Breasts: No  masses or discharge.  Symmetric.  No axillary adenopathy.  Lungs: Clear to auscultation.No rales or wheezes. Normal Respiratory effort, no retractions.  Heart: NSR.  No murmurs or rubs appreciated. No peripheral edema  Abdomen: Soft.  Non-tender.  No masses.  No HSM. No hernia  Extremities: Moves all appropriately.  Normal ROM for age. No lymphadenopathy.  Neuro: Oriented to PPT.  Normal mood. Normal affect.     Pelvic:   Vulva: Normal appearance.  No lesions.  Vagina: No lesions or abnormalities noted.  Support: Normal pelvic support.  Urethra No masses tenderness or scarring.  Meatus Normal size without lesions or prolapse.  Cervix: Normal appearance.  No lesions.  Anus: Normal exam.  No lesions.  Perineum: Normal exam.  No lesions.        Bimanual   Uterus: Normal size.  Non-tender.  Mobile.  AV.  Adnexae: No masses.  Non-tender to palpation.  Cul-de-sac: Negative for abnormality.    ASSESSMENT  1) Annual exam  PLAN 1) Physical exam as noted. Discussed healthy lifestyle choices and preventive care. 2) 3) Return in one year for annual exam or as needed for concerns.   Melissa Swanson, CNM

## 2024-10-30 ENCOUNTER — Ambulatory Visit: Admitting: Obstetrics

## 2024-10-30 DIAGNOSIS — Z131 Encounter for screening for diabetes mellitus: Secondary | ICD-10-CM

## 2024-10-30 DIAGNOSIS — Z1329 Encounter for screening for other suspected endocrine disorder: Secondary | ICD-10-CM

## 2024-10-30 DIAGNOSIS — Z01419 Encounter for gynecological examination (general) (routine) without abnormal findings: Secondary | ICD-10-CM

## 2024-10-30 DIAGNOSIS — Z124 Encounter for screening for malignant neoplasm of cervix: Secondary | ICD-10-CM

## 2024-12-18 ENCOUNTER — Other Ambulatory Visit (HOSPITAL_COMMUNITY)
Admission: RE | Admit: 2024-12-18 | Discharge: 2024-12-18 | Disposition: A | Source: Ambulatory Visit | Attending: Obstetrics | Admitting: Obstetrics

## 2024-12-18 ENCOUNTER — Ambulatory Visit: Admitting: Obstetrics

## 2024-12-18 ENCOUNTER — Encounter: Payer: Self-pay | Admitting: Obstetrics

## 2024-12-18 VITALS — BP 94/67 | HR 84 | Ht 67.0 in | Wt 147.0 lb

## 2024-12-18 DIAGNOSIS — Z124 Encounter for screening for malignant neoplasm of cervix: Secondary | ICD-10-CM | POA: Insufficient documentation

## 2024-12-18 DIAGNOSIS — R32 Unspecified urinary incontinence: Secondary | ICD-10-CM | POA: Diagnosis not present

## 2024-12-18 DIAGNOSIS — Z01419 Encounter for gynecological examination (general) (routine) without abnormal findings: Secondary | ICD-10-CM | POA: Diagnosis present

## 2024-12-18 DIAGNOSIS — Z01411 Encounter for gynecological examination (general) (routine) with abnormal findings: Secondary | ICD-10-CM

## 2024-12-18 LAB — POCT URINALYSIS DIPSTICK
Bilirubin, UA: NEGATIVE
Blood, UA: NEGATIVE
Glucose, UA: NEGATIVE
Ketones, UA: NEGATIVE
Leukocytes, UA: NEGATIVE
Nitrite, UA: NEGATIVE
Protein, UA: NEGATIVE
Spec Grav, UA: 1.01
Urobilinogen, UA: 1 U/dL
pH, UA: 6

## 2024-12-18 NOTE — Progress Notes (Signed)
 "  ANNUAL GYNECOLOGICAL EXAM  SUBJECTIVE  HPI  Bethany Gutierrez is a 34 y.o.-year-old G2P2002 who presents for an annual gynecological exam today.  She denies dyspareunia, abnormal vaginal bleeding or discharge. A few months ago, she began leaking urine and feeling pelvic and back pain/heaviness. She has been doing pelvic floor exercises which have helped significantly.  Medical/Surgical History Past Medical History:  Diagnosis Date   Asthma    Environmental and seasonal allergies    Family history of adverse reaction to anesthesia    mother and sister post op nausea and vomiting   GERD (gastroesophageal reflux disease)    History of kidney stones    Pneumonia    covid related   Postoperative state 01/31/2022   Prenatal hydronephrosis during pregnancy in third trimester, antepartum 12/10/2021   Seasonal allergies    Term pregnancy, repeat 01/31/2022   Unwanted fertility    Past Surgical History:  Procedure Laterality Date   CESAREAN SECTION N/A 02/22/2019   Procedure: CESAREAN SECTION;  Surgeon: Janit Alm Agent, MD;  Location: [facility];  Service: Obstetrics;  Laterality: N/A;   CESAREAN SECTION WITH BILATERAL TUBAL LIGATION N/A 01/31/2022   Procedure: Repeat Cesarean Delivery with Sterilization using Filshie Clips;  Surgeon: Janit Alm Agent, MD;  Location: [facility];  Service: Obstetrics;  Laterality: N/A;   TONSILLECTOMY     WISDOM TOOTH EXTRACTION      Social History Lives with husband and children. Feels safe there Work: remote production designer, theatre/television/film work for [employer] Exercise: daily Substances: Denies EtOH, tobacco, vape, and recreational drugs  Obstetric History OB History     Gravida  2   Para  2   Term  2   Preterm      AB      Living  2      SAB      IAB      Ectopic      Multiple  0   Live Births  2            GYN/Menstrual History Patient's last menstrual period was 12/08/2024. Periods slightly irregular but occur monthly Last  Pap: 08/2021 - NILM Contraception: BTL  Prevention Mammogram: at 41 Colonoscopy: at 46  Current Medications Show/hide medication list[1]      ROS Constitutional: Denied constitutional symptoms, night sweats, recent illness, fatigue, fever, insomnia and weight loss.  Eyes: Denied eye symptoms, eye pain, photophobia, vision change and visual disturbance.  Ears/Nose/Throat/Neck: Denied ear, nose, throat or neck symptoms, hearing loss, nasal discharge, sinus congestion and sore throat.  Cardiovascular: Denied cardiovascular symptoms, arrhythmia, chest pain/pressure, edema, exercise intolerance, orthopnea and palpitations.  Respiratory: Denied pulmonary symptoms, asthma, pleuritic pain, productive sputum, cough, dyspnea and wheezing.  Gastrointestinal: Denied gastro-esophageal reflux, melena, nausea and vomiting.  Genitourinary: See HPI  Musculoskeletal: Denied musculoskeletal symptoms, stiffness, swelling, muscle weakness and myalgia.  Dermatologic: Denied dermatology symptoms, rash and scar.  Neurologic: Denied neurology symptoms, dizziness, headache, neck pain and syncope.  Psychiatric: Denied psychiatric symptoms, anxiety and depression.  Endocrine: Denied endocrine symptoms including hot flashes and night sweats.    OBJECTIVE  BP 94/67   Pulse 84   Ht 5' 7 (1.702 m)   Wt 147 lb (66.7 kg)   LMP 12/08/2024   BMI 23.02 kg/m    Urine dip unremarkable  Physical examination General NAD, Conversant  HEENT Atraumatic; Op clear with mmm.  Normo-cephalic. Pupils reactive. Anicteric sclerae  Thyroid/Neck Smooth without nodularity or enlargement. Normal ROM.  Neck Supple.  Skin No rashes, lesions or ulceration. Normal palpated skin turgor. No nodularity.  Breasts: No masses or discharge.  Symmetric.  No axillary adenopathy.  Lungs: Clear to auscultation.No rales or wheezes. Normal Respiratory effort, no retractions.  Heart: NSR.  No murmurs or rubs appreciated. No peripheral edema   Abdomen: Soft.  Non-tender.  No masses.  No HSM. No hernia  Extremities: Moves all appropriately.  Normal ROM for age. No lymphadenopathy.  Neuro: Oriented to PPT.  Normal mood. Normal affect.     Pelvic:   Vulva: Normal appearance.  No lesions.  Vagina: No lesions or abnormalities noted. Narrow pelvic arch  Support: Normal pelvic support.  Urethra No masses tenderness or scarring.  Meatus Normal size without lesions or prolapse.  Cervix: Normal appearance.  No lesions. Pap collected.  Anus: Normal exam.  No lesions.  Perineum: Normal exam.  No lesions.    ASSESSMENT  1) Annual exam 2) Due for Pap  PLAN 1) Physical exam as noted. Discussed healthy lifestyle choices and preventive care. Declines STI testing. Labs: A1C, BMP, CBC, lipid profile, TSH.  2) Pap collected. F/u based on results  Return in one year for annual exam or as needed for concerns. May reach out for referral if she decides she wants pelvic PT.   Eleanor Canny, CNM     [1]  Outpatient Medications Prior to Visit  Medication Sig   albuterol  (VENTOLIN  HFA) 108 (90 Base) MCG/ACT inhaler Inhale 1 puff into the lungs every 6 (six) hours as needed.   famotidine  (PEPCID ) 40 MG tablet Take 40 mg by mouth at bedtime.   pantoprazole (PROTONIX) 40 MG tablet Take 40 mg by mouth daily.   No facility-administered medications prior to visit.   "

## 2024-12-19 LAB — LIPID PANEL
Chol/HDL Ratio: 3 ratio (ref 0.0–4.4)
Cholesterol, Total: 160 mg/dL (ref 100–199)
HDL: 53 mg/dL
LDL Chol Calc (NIH): 91 mg/dL (ref 0–99)
Triglycerides: 88 mg/dL (ref 0–149)
VLDL Cholesterol Cal: 16 mg/dL (ref 5–40)

## 2024-12-19 LAB — CBC
Hematocrit: 44.7 % (ref 34.0–46.6)
Hemoglobin: 15.3 g/dL (ref 11.1–15.9)
MCH: 32.6 pg (ref 26.6–33.0)
MCHC: 34.2 g/dL (ref 31.5–35.7)
MCV: 95 fL (ref 79–97)
Platelets: 234 x10E3/uL (ref 150–450)
RBC: 4.69 x10E6/uL (ref 3.77–5.28)
RDW: 11.5 % — ABNORMAL LOW (ref 11.7–15.4)
WBC: 7.8 x10E3/uL (ref 3.4–10.8)

## 2024-12-19 LAB — BASIC METABOLIC PANEL WITH GFR
BUN/Creatinine Ratio: 13 (ref 9–23)
BUN: 10 mg/dL (ref 6–20)
CO2: 18 mmol/L — ABNORMAL LOW (ref 20–29)
Calcium: 9.4 mg/dL (ref 8.7–10.2)
Chloride: 103 mmol/L (ref 96–106)
Creatinine, Ser: 0.76 mg/dL (ref 0.57–1.00)
Glucose: 78 mg/dL (ref 70–99)
Potassium: 4.1 mmol/L (ref 3.5–5.2)
Sodium: 138 mmol/L (ref 134–144)
eGFR: 107 mL/min/1.73

## 2024-12-19 LAB — HEMOGLOBIN A1C
Est. average glucose Bld gHb Est-mCnc: 97 mg/dL
Hgb A1c MFr Bld: 5 % (ref 4.8–5.6)

## 2024-12-19 LAB — TSH: TSH: 1.39 u[IU]/mL (ref 0.450–4.500)

## 2024-12-20 ENCOUNTER — Ambulatory Visit: Payer: Self-pay | Admitting: Obstetrics

## 2024-12-25 ENCOUNTER — Encounter: Payer: Self-pay | Admitting: Obstetrics

## 2024-12-25 LAB — CYTOLOGY - PAP
Comment: NEGATIVE
Comment: NEGATIVE
Comment: NEGATIVE
Diagnosis: UNDETERMINED — AB
HPV 16: NEGATIVE
HPV 18 / 45: NEGATIVE
High risk HPV: POSITIVE — AB

## 2025-09-22 ENCOUNTER — Ambulatory Visit: Admitting: Dermatology
